# Patient Record
Sex: Female | Born: 1960 | Race: Black or African American | Hispanic: No | Marital: Single | Smoking: Current every day smoker
Health system: Southern US, Community
[De-identification: ages and names within clinical notes are randomized; demographics above are authoritative.]

## PROBLEM LIST (undated history)

## (undated) DIAGNOSIS — F1010 Alcohol abuse, uncomplicated: Secondary | ICD-10-CM

## (undated) HISTORY — PX: BREAST LUMPECTOMY: SHX2

---
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
## 2001-11-14 ENCOUNTER — Encounter: Payer: Self-pay | Admitting: Emergency Medicine

## 2001-11-14 ENCOUNTER — Emergency Department (HOSPITAL_COMMUNITY): Admission: EM | Admit: 2001-11-14 | Discharge: 2001-11-14 | Payer: Self-pay | Admitting: Emergency Medicine

## 2002-06-24 ENCOUNTER — Ambulatory Visit (HOSPITAL_COMMUNITY): Admission: RE | Admit: 2002-06-24 | Discharge: 2002-06-24 | Payer: Self-pay

## 2011-05-13 ENCOUNTER — Inpatient Hospital Stay (INDEPENDENT_AMBULATORY_CARE_PROVIDER_SITE_OTHER)
Admission: RE | Admit: 2011-05-13 | Discharge: 2011-05-13 | Disposition: A | Discharge status: Home / Self Care | Payer: Self-pay | Source: Ambulatory Visit | Attending: Family Medicine | Admitting: Family Medicine

## 2011-05-13 ENCOUNTER — Ambulatory Visit (INDEPENDENT_AMBULATORY_CARE_PROVIDER_SITE_OTHER): Payer: Self-pay

## 2011-05-13 DIAGNOSIS — S52609B Unspecified fracture of lower end of unspecified ulna, initial encounter for open fracture type I or II: Secondary | ICD-10-CM

## 2011-05-13 DIAGNOSIS — H6120 Impacted cerumen, unspecified ear: Secondary | ICD-10-CM

## 2011-07-19 ENCOUNTER — Emergency Department (HOSPITAL_COMMUNITY)
Admission: EM | Admit: 2011-07-19 | Discharge: 2011-07-19 | Disposition: A | Discharge status: Home / Self Care | Payer: Self-pay

## 2011-07-19 NOTE — ED Notes (Signed)
Patient stated that she was leaving because there were too many people waiting to be seen and walked out of the department with her family member.

## 2011-08-30 ENCOUNTER — Ambulatory Visit (INDEPENDENT_AMBULATORY_CARE_PROVIDER_SITE_OTHER): Payer: Self-pay | Admitting: *Deleted

## 2011-08-30 ENCOUNTER — Ambulatory Visit (HOSPITAL_COMMUNITY)
Admission: RE | Admit: 2011-08-30 | Discharge: 2011-08-30 | Disposition: A | Discharge status: Home / Self Care | Payer: No Typology Code available for payment source | Source: Ambulatory Visit | Attending: Obstetrics and Gynecology | Admitting: Obstetrics and Gynecology

## 2011-08-30 ENCOUNTER — Other Ambulatory Visit: Payer: Self-pay | Admitting: Obstetrics and Gynecology

## 2011-08-30 VITALS — BP 132/77 | HR 78 | Temp 99.0°F | Resp 16 | Ht 65.0 in | Wt 99.0 lb

## 2011-08-30 DIAGNOSIS — Z1231 Encounter for screening mammogram for malignant neoplasm of breast: Secondary | ICD-10-CM

## 2011-08-30 DIAGNOSIS — Z01419 Encounter for gynecological examination (general) (routine) without abnormal findings: Secondary | ICD-10-CM

## 2011-08-30 NOTE — Patient Instructions (Signed)
Taught patient how to perform BSE and gave educational materials to take home. Let patient know [facility] will cover Pap smears every 3 years unless has a history of abnormal Pap smears. Patient is escorted to mammography for a screening mammogram.  Let patient know will follow up with her within the next couple weeks with results by letter or phone. Patient verbalized understanding.

## 2011-08-30 NOTE — Progress Notes (Signed)
No complaints today.  Pap Smear:    Completed Pap smear today. Last Pap smear was at least 3 years ago per patient at one of the free cervical cancer screenings at the [facility]. Per patient no history of abnormal Pap smears. No Pap smear results in EPIC.  Physical exam: Breasts Breasts symmetrical. No skin abnormalities right breast. Scar under the left breast where patient states had a lumpectomy 20 years ago for benign reasons. No nipple retraction bilateral breasts. No nipple discharge bilateral breasts. No lymphadenopathy. No lumps palpated bilateral breasts.          Pelvic/Bimanual   Ext Genitalia No lesions, no swelling and no discharge observed on external genitalia.         Vagina Vagina pink and normal texture. No lesions or discharge observed in vagina.          Cervix Cervix is present. Cervix pink and of normal texture. No discharge observed at cervical os.    Uterus Uterus is present and palpable. Uterus in normal position and normal size.       Adnexae Bilateral ovaries present and palpable. No tenderness on palpation.        Rectovaginal No rectal exam completed today since patient had no rectal complaints. No skin abnormalities observed on rectal area.

## 2011-09-06 ENCOUNTER — Other Ambulatory Visit: Payer: Self-pay | Admitting: Obstetrics and Gynecology

## 2011-09-06 DIAGNOSIS — R928 Other abnormal and inconclusive findings on diagnostic imaging of breast: Secondary | ICD-10-CM

## 2011-09-12 ENCOUNTER — Telehealth: Payer: Self-pay | Admitting: *Deleted

## 2011-09-12 NOTE — Telephone Encounter (Signed)
Patient left me a voicemail to call her back in regards to her mammogram results. Attempted to call patient at [phone] and was given an alternative number to reach patient. The number given was [phone]. Attempted to call the alternative number. No one answered phone. Left message for patient to call me back.

## 2011-09-14 ENCOUNTER — Telehealth: Payer: Self-pay | Admitting: *Deleted

## 2011-09-14 NOTE — Telephone Encounter (Signed)
Patient had called and left me a voicemail in regards to needed follow up on mammogram. Called patient back and let her know she can call the [facility] to schedule her follow up appointment for her diagnostic mammogram of her left breast. Gave her phone number and informed patient that [facility] will cover follow up. Also, gave patient results to Pap smear letting her know it was normal. Patient verbalized understanding.

## 2011-09-21 ENCOUNTER — Other Ambulatory Visit: Payer: Self-pay

## 2011-09-28 ENCOUNTER — Other Ambulatory Visit: Payer: Self-pay

## 2011-10-05 ENCOUNTER — Ambulatory Visit
Admission: RE | Admit: 2011-10-05 | Discharge: 2011-10-05 | Disposition: A | Discharge status: Home / Self Care | Payer: No Typology Code available for payment source | Source: Ambulatory Visit | Attending: Obstetrics and Gynecology | Admitting: Obstetrics and Gynecology

## 2011-10-05 ENCOUNTER — Other Ambulatory Visit: Payer: Self-pay | Admitting: Obstetrics and Gynecology

## 2011-10-05 DIAGNOSIS — R928 Other abnormal and inconclusive findings on diagnostic imaging of breast: Secondary | ICD-10-CM

## 2011-10-11 ENCOUNTER — Other Ambulatory Visit: Payer: No Typology Code available for payment source

## 2011-10-12 ENCOUNTER — Ambulatory Visit
Admission: RE | Admit: 2011-10-12 | Discharge: 2011-10-12 | Disposition: A | Discharge status: Home / Self Care | Payer: No Typology Code available for payment source | Source: Ambulatory Visit | Attending: Obstetrics and Gynecology | Admitting: Obstetrics and Gynecology

## 2011-10-12 DIAGNOSIS — R928 Other abnormal and inconclusive findings on diagnostic imaging of breast: Secondary | ICD-10-CM

## 2011-11-25 ENCOUNTER — Inpatient Hospital Stay (HOSPITAL_COMMUNITY)
Admission: EM | Admit: 2011-11-25 | Discharge: 2011-12-07 | DRG: 327 | Disposition: A | Discharge status: Home / Self Care | Payer: Self-pay | Attending: Surgery | Admitting: Surgery

## 2011-11-25 ENCOUNTER — Encounter (HOSPITAL_COMMUNITY): Payer: Self-pay | Admitting: Emergency Medicine

## 2011-11-25 DIAGNOSIS — K567 Ileus, unspecified: Secondary | ICD-10-CM | POA: Diagnosis not present

## 2011-11-25 DIAGNOSIS — F1010 Alcohol abuse, uncomplicated: Secondary | ICD-10-CM | POA: Diagnosis present

## 2011-11-25 DIAGNOSIS — K275 Chronic or unspecified peptic ulcer, site unspecified, with perforation: Secondary | ICD-10-CM

## 2011-11-25 DIAGNOSIS — K560 Paralytic ileus: Secondary | ICD-10-CM | POA: Diagnosis not present

## 2011-11-25 DIAGNOSIS — F1990 Other psychoactive substance use, unspecified, uncomplicated: Secondary | ICD-10-CM | POA: Diagnosis present

## 2011-11-25 DIAGNOSIS — E876 Hypokalemia: Secondary | ICD-10-CM | POA: Diagnosis not present

## 2011-11-25 DIAGNOSIS — F17200 Nicotine dependence, unspecified, uncomplicated: Secondary | ICD-10-CM | POA: Diagnosis present

## 2011-11-25 DIAGNOSIS — R112 Nausea with vomiting, unspecified: Secondary | ICD-10-CM | POA: Diagnosis present

## 2011-11-25 DIAGNOSIS — K9189 Other postprocedural complications and disorders of digestive system: Secondary | ICD-10-CM | POA: Diagnosis not present

## 2011-11-25 DIAGNOSIS — Z681 Body mass index (BMI) 19 or less, adult: Secondary | ICD-10-CM

## 2011-11-25 DIAGNOSIS — K251 Acute gastric ulcer with perforation: Principal | ICD-10-CM | POA: Diagnosis present

## 2011-11-25 DIAGNOSIS — R197 Diarrhea, unspecified: Secondary | ICD-10-CM | POA: Diagnosis not present

## 2011-11-25 DIAGNOSIS — E46 Unspecified protein-calorie malnutrition: Secondary | ICD-10-CM | POA: Diagnosis present

## 2011-11-25 DIAGNOSIS — F1410 Cocaine abuse, uncomplicated: Secondary | ICD-10-CM | POA: Diagnosis present

## 2011-11-25 HISTORY — DX: Alcohol abuse, uncomplicated: F10.10

## 2011-11-25 NOTE — ED Notes (Signed)
Pt presented to the ER with c/o abd pain, epigastric, 8/10, intermittent, and right shoulder pain, pt further explains that sx are present for about week now and progressively worsen.

## 2011-11-25 NOTE — ED Notes (Signed)
Pt reports chronic ETOH heavy use, had pain in mid abdomen for 5 days. Pt appears intoxicated. Denies SI and HI.

## 2011-11-25 NOTE — ED Notes (Signed)
[id number]> Expected date:<BR> Expected time:<BR> Means of arrival:<BR> Comments:<BR> Hold

## 2011-11-26 ENCOUNTER — Emergency Department (HOSPITAL_COMMUNITY): Payer: Self-pay

## 2011-11-26 ENCOUNTER — Emergency Department (HOSPITAL_COMMUNITY): Payer: Self-pay | Admitting: Certified Registered Nurse Anesthetist

## 2011-11-26 ENCOUNTER — Encounter (HOSPITAL_COMMUNITY): Payer: Self-pay | Admitting: Certified Registered Nurse Anesthetist

## 2011-11-26 ENCOUNTER — Encounter (HOSPITAL_COMMUNITY)
Admission: EM | Disposition: A | Discharge status: Home / Self Care | Payer: Self-pay | Source: Home / Self Care | Attending: Surgery

## 2011-11-26 DIAGNOSIS — K255 Chronic or unspecified gastric ulcer with perforation: Secondary | ICD-10-CM

## 2011-11-26 HISTORY — PX: LAPAROTOMY: SHX154

## 2011-11-26 LAB — CBC
MCH: 33.9 pg (ref 26.0–34.0)
Platelets: 251 10*3/uL (ref 150–400)
RBC: 4.72 MIL/uL (ref 3.87–5.11)
WBC: 3.3 10*3/uL — ABNORMAL LOW (ref 4.0–10.5)

## 2011-11-26 LAB — RAPID URINE DRUG SCREEN, HOSP PERFORMED
Amphetamines: NOT DETECTED
Opiates: NOT DETECTED
Tetrahydrocannabinol: POSITIVE — AB

## 2011-11-26 LAB — COMPREHENSIVE METABOLIC PANEL
ALT: 22 U/L (ref 0–35)
AST: 44 U/L — ABNORMAL HIGH (ref 0–37)
CO2: 27 mEq/L (ref 19–32)
Calcium: 8.7 mg/dL (ref 8.4–10.5)
Chloride: 96 mEq/L (ref 96–112)
GFR calc non Af Amer: >90 mL/min (ref >90)
Sodium: 136 mEq/L (ref 135–145)

## 2011-11-26 SURGERY — LAPAROTOMY, EXPLORATORY
Anesthesia: General | Site: Abdomen | Wound class: Dirty or Infected

## 2011-11-26 MED ORDER — DIPHENHYDRAMINE HCL 50 MG/ML IJ SOLN
12.5000 mg | Freq: Four times a day (QID) | INTRAMUSCULAR | Status: DC | PRN
  Administered 2011-11-26 – 2011-11-27 (×2): 12.5 mg via INTRAVENOUS
  Filled 2011-11-26 (×2): qty 1

## 2011-11-26 MED ORDER — PHENYLEPHRINE HCL 10 MG/ML IJ SOLN
20.0000 mg | INTRAVENOUS | Status: DC | PRN
  Administered 2011-11-26: 50 ug/min via INTRAVENOUS

## 2011-11-26 MED ORDER — NEOSTIGMINE METHYLSULFATE 1 MG/ML IJ SOLN
INTRAMUSCULAR | Status: DC | PRN
  Administered 2011-11-26: 4 mg via INTRAVENOUS

## 2011-11-26 MED ORDER — ONDANSETRON HCL 4 MG PO TABS
4.0000 mg | ORAL_TABLET | Freq: Four times a day (QID) | ORAL | Status: DC | PRN
  Filled 2011-11-26: qty 1

## 2011-11-26 MED ORDER — MUPIROCIN 2 % EX OINT
1.0000 "application " | TOPICAL_OINTMENT | Freq: Two times a day (BID) | CUTANEOUS | Status: AC
  Administered 2011-11-26 – 2011-11-30 (×10): 1 via NASAL
  Filled 2011-11-26: qty 22

## 2011-11-26 MED ORDER — MORPHINE SULFATE (PF) 1 MG/ML IV SOLN
INTRAVENOUS | Status: DC
  Administered 2011-11-26: 07:00:00 via INTRAVENOUS
  Administered 2011-11-26 (×2): 7.5 mg via INTRAVENOUS
  Administered 2011-11-27: 6 mg via INTRAVENOUS
  Administered 2011-11-27: 1.5 mg via INTRAVENOUS
  Administered 2011-11-27: 14:00:00 via INTRAVENOUS
  Administered 2011-11-27: 4.5 mg via INTRAVENOUS
  Administered 2011-11-27: 3 mg via INTRAVENOUS
  Administered 2011-11-28: 4.5 mg via INTRAVENOUS
  Administered 2011-11-28: 3 mg via INTRAVENOUS
  Administered 2011-11-28: 9 mg via INTRAVENOUS
  Administered 2011-11-28: 5.51 mg via INTRAVENOUS
  Administered 2011-11-28: 4.5 mg via INTRAVENOUS
  Administered 2011-11-28: 7.5 mg via INTRAVENOUS
  Administered 2011-11-28: 1.5 mg via INTRAVENOUS
  Administered 2011-11-28: 09:00:00 via INTRAVENOUS
  Administered 2011-11-29: 2.43 mg via INTRAVENOUS
  Administered 2011-11-29: 07:00:00 via INTRAVENOUS
  Administered 2011-11-29: 3 mg via INTRAVENOUS
  Administered 2011-11-29: 4.5 mg via INTRAVENOUS
  Administered 2011-11-29: 1.5 mg via INTRAVENOUS
  Administered 2011-11-29: 4.5 mg via INTRAVENOUS
  Administered 2011-11-30: 6 mg via INTRAVENOUS
  Administered 2011-11-30: 3 mg via INTRAVENOUS
  Administered 2011-11-30: 12:00:00 via INTRAVENOUS
  Administered 2011-11-30 – 2011-12-01 (×3): 3 mg via INTRAVENOUS
  Administered 2011-12-01: 0.49 mg via INTRAVENOUS
  Administered 2011-12-01: 4.79 mg via INTRAVENOUS
  Administered 2011-12-01: 4.5 mg via INTRAVENOUS
  Administered 2011-12-01: 1.5 mg via INTRAVENOUS
  Administered 2011-12-01: 17:00:00 via INTRAVENOUS
  Administered 2011-12-02: 7.5 mg via INTRAVENOUS
  Administered 2011-12-02 (×2): 3 mg via INTRAVENOUS
  Administered 2011-12-02: 1.5 mg via INTRAVENOUS
  Filled 2011-11-26 (×7): qty 25

## 2011-11-26 MED ORDER — PANTOPRAZOLE SODIUM 40 MG IV SOLR
40.0000 mg | Freq: Two times a day (BID) | INTRAVENOUS | Status: DC
  Administered 2011-11-26 – 2011-12-03 (×16): 40 mg via INTRAVENOUS
  Filled 2011-11-26 (×20): qty 40

## 2011-11-26 MED ORDER — BIOTENE DRY MOUTH MT LIQD
15.0000 mL | Freq: Two times a day (BID) | OROMUCOSAL | Status: DC
  Administered 2011-11-27 – 2011-12-07 (×18): 15 mL via OROMUCOSAL

## 2011-11-26 MED ORDER — HETASTARCH-ELECTROLYTES 6 % IV SOLN
INTRAVENOUS | Status: DC | PRN
  Administered 2011-11-26: 04:00:00 via INTRAVENOUS

## 2011-11-26 MED ORDER — DEXAMETHASONE SODIUM PHOSPHATE 10 MG/ML IJ SOLN
INTRAMUSCULAR | Status: DC | PRN
  Administered 2011-11-26: 10 mg via INTRAVENOUS

## 2011-11-26 MED ORDER — SODIUM CHLORIDE 0.9 % IJ SOLN
9.0000 mL | INTRAMUSCULAR | Status: DC | PRN
  Administered 2011-11-29: 9 mL via INTRAVENOUS

## 2011-11-26 MED ORDER — DIPHENHYDRAMINE HCL 12.5 MG/5ML PO ELIX
12.5000 mg | ORAL_SOLUTION | Freq: Four times a day (QID) | ORAL | Status: DC | PRN
  Filled 2011-11-26: qty 5

## 2011-11-26 MED ORDER — FENTANYL CITRATE 0.05 MG/ML IJ SOLN
INTRAMUSCULAR | Status: DC | PRN
  Administered 2011-11-26: 25 ug via INTRAVENOUS
  Administered 2011-11-26: 50 ug via INTRAVENOUS
  Administered 2011-11-26 (×3): 25 ug via INTRAVENOUS

## 2011-11-26 MED ORDER — PIPERACILLIN-TAZOBACTAM 3.375 G IVPB
3.3750 g | Freq: Three times a day (TID) | INTRAVENOUS | Status: DC
  Administered 2011-11-26 – 2011-12-06 (×30): 3.375 g via INTRAVENOUS
  Filled 2011-11-26 (×35): qty 50

## 2011-11-26 MED ORDER — HYDROMORPHONE HCL PF 1 MG/ML IJ SOLN
1.0000 mg | Freq: Once | INTRAMUSCULAR | Status: AC
  Administered 2011-11-26: 1 mg via INTRAVENOUS
  Filled 2011-11-26: qty 1

## 2011-11-26 MED ORDER — PROMETHAZINE HCL 25 MG/ML IJ SOLN: 6.2500 mg | INTRAMUSCULAR | Status: DC | PRN

## 2011-11-26 MED ORDER — MIDAZOLAM HCL 5 MG/5ML IJ SOLN
INTRAMUSCULAR | Status: DC | PRN
  Administered 2011-11-26: 2 mg via INTRAVENOUS

## 2011-11-26 MED ORDER — ONDANSETRON HCL 4 MG/2ML IJ SOLN
INTRAMUSCULAR | Status: DC | PRN
  Administered 2011-11-26: 4 mg via INTRAVENOUS

## 2011-11-26 MED ORDER — LORAZEPAM 1 MG PO TABS: 1.0000 mg | ORAL_TABLET | Freq: Four times a day (QID) | ORAL | Status: AC | PRN

## 2011-11-26 MED ORDER — LORAZEPAM 2 MG/ML IJ SOLN: 1.0000 mg | Freq: Four times a day (QID) | INTRAMUSCULAR | Status: AC | PRN

## 2011-11-26 MED ORDER — SODIUM CHLORIDE 0.9 % IV BOLUS (SEPSIS)
1000.0000 mL | Freq: Once | INTRAVENOUS | Status: AC
  Administered 2011-11-26: 1000 mL via INTRAVENOUS

## 2011-11-26 MED ORDER — CHLORHEXIDINE GLUCONATE 0.12 % MT SOLN
15.0000 mL | Freq: Two times a day (BID) | OROMUCOSAL | Status: DC
  Administered 2011-11-26 – 2011-12-07 (×21): 15 mL via OROMUCOSAL
  Filled 2011-11-26 (×25): qty 15

## 2011-11-26 MED ORDER — LIDOCAINE HCL (CARDIAC) 20 MG/ML IV SOLN
INTRAVENOUS | Status: DC | PRN
  Administered 2011-11-26: 60 mg via INTRAVENOUS

## 2011-11-26 MED ORDER — HEPARIN SODIUM (PORCINE) 5000 UNIT/ML IJ SOLN
5000.0000 [IU] | Freq: Three times a day (TID) | INTRAMUSCULAR | Status: DC
  Administered 2011-11-27 – 2011-12-07 (×32): 5000 [IU] via SUBCUTANEOUS
  Filled 2011-11-26 (×34): qty 1

## 2011-11-26 MED ORDER — 0.9 % SODIUM CHLORIDE (POUR BTL) OPTIME
TOPICAL | Status: DC | PRN
  Administered 2011-11-26: 4000 mL

## 2011-11-26 MED ORDER — ONDANSETRON HCL 4 MG/2ML IJ SOLN: 4.0000 mg | Freq: Four times a day (QID) | INTRAMUSCULAR | Status: DC | PRN

## 2011-11-26 MED ORDER — LACTATED RINGERS IV SOLN
INTRAVENOUS | Status: DC | PRN
  Administered 2011-11-26: 04:00:00 via INTRAVENOUS

## 2011-11-26 MED ORDER — CHLORHEXIDINE GLUCONATE CLOTH 2 % EX PADS
6.0000 | MEDICATED_PAD | Freq: Every day | CUTANEOUS | Status: AC
  Administered 2011-11-27 – 2011-12-01 (×5): 6 via TOPICAL

## 2011-11-26 MED ORDER — NALOXONE HCL 0.4 MG/ML IJ SOLN: 0.4000 mg | INTRAMUSCULAR | Status: DC | PRN

## 2011-11-26 MED ORDER — VITAMIN B-1 100 MG PO TABS
100.0000 mg | ORAL_TABLET | Freq: Every day | ORAL | Status: DC
  Administered 2011-11-29: 100 mg via ORAL
  Filled 2011-11-26 (×4): qty 1

## 2011-11-26 MED ORDER — SUCCINYLCHOLINE CHLORIDE 20 MG/ML IJ SOLN
INTRAMUSCULAR | Status: DC | PRN
  Administered 2011-11-26: 100 mg via INTRAVENOUS

## 2011-11-26 MED ORDER — PIPERACILLIN-TAZOBACTAM 3.375 G IVPB
3.3750 g | Freq: Once | INTRAVENOUS | Status: AC
  Administered 2011-11-26: 3.375 g via INTRAVENOUS
  Filled 2011-11-26: qty 50

## 2011-11-26 MED ORDER — HYDROMORPHONE HCL PF 1 MG/ML IJ SOLN
INTRAMUSCULAR | Status: AC
  Filled 2011-11-26: qty 1

## 2011-11-26 MED ORDER — HYDROMORPHONE HCL PF 1 MG/ML IJ SOLN
0.2500 mg | INTRAMUSCULAR | Status: DC | PRN
  Administered 2011-11-26: 0.5 mg via INTRAVENOUS

## 2011-11-26 MED ORDER — PROPOFOL 10 MG/ML IV EMUL
INTRAVENOUS | Status: DC | PRN
  Administered 2011-11-26: 100 mg via INTRAVENOUS

## 2011-11-26 MED ORDER — GLYCOPYRROLATE 0.2 MG/ML IJ SOLN
INTRAMUSCULAR | Status: DC | PRN
  Administered 2011-11-26: .6 mg via INTRAVENOUS

## 2011-11-26 MED ORDER — THIAMINE HCL 100 MG/ML IJ SOLN
100.0000 mg | Freq: Every day | INTRAMUSCULAR | Status: DC
  Administered 2011-11-26 – 2011-12-03 (×7): 100 mg via INTRAVENOUS
  Filled 2011-11-26 (×9): qty 1

## 2011-11-26 MED ORDER — KCL IN DEXTROSE-NACL 20-5-0.9 MEQ/L-%-% IV SOLN
INTRAVENOUS | Status: DC
  Administered 2011-11-26 – 2011-12-02 (×12): via INTRAVENOUS
  Administered 2011-12-02: 1000 mL via INTRAVENOUS
  Administered 2011-12-03: 100 mL/h via INTRAVENOUS
  Administered 2011-12-03 (×2): via INTRAVENOUS
  Filled 2011-11-26 (×28): qty 1000

## 2011-11-26 MED ORDER — ROCURONIUM BROMIDE 100 MG/10ML IV SOLN
INTRAVENOUS | Status: DC | PRN
  Administered 2011-11-26: 25 mg via INTRAVENOUS
  Administered 2011-11-26: 5 mg via INTRAVENOUS

## 2011-11-26 SURGICAL SUPPLY — 40 items
APPLICATOR COTTON TIP 6IN STRL (MISCELLANEOUS) IMPLANT
BLADE EXTENDED COATED 6.5IN (ELECTRODE) IMPLANT
BLADE HEX COATED 2.75 (ELECTRODE) ×2 IMPLANT
CANISTER SUCTION 2500CC (MISCELLANEOUS) IMPLANT
CLOTH BEACON ORANGE TIMEOUT ST (SAFETY) ×2 IMPLANT
COVER MAYO STAND STRL (DRAPES) IMPLANT
DRAPE LAPAROSCOPIC ABDOMINAL (DRAPES) ×2 IMPLANT
DRAPE UTILITY XL STRL (DRAPES) ×2 IMPLANT
DRAPE WARM FLUID 44X44 (DRAPE) ×2 IMPLANT
DRESSING TELFA 8X3 (GAUZE/BANDAGES/DRESSINGS) ×2 IMPLANT
DRSG PAD ABDOMINAL 8X10 ST (GAUZE/BANDAGES/DRESSINGS) ×2 IMPLANT
ELECT REM PT RETURN 9FT ADLT (ELECTROSURGICAL) ×2
ELECTRODE REM PT RTRN 9FT ADLT (ELECTROSURGICAL) ×1 IMPLANT
GLOVE BIOGEL PI IND STRL 7.0 (GLOVE) IMPLANT
GLOVE BIOGEL PI INDICATOR 7.0 (GLOVE)
GLOVE ECLIPSE 8.0 STRL XLNG CF (GLOVE) ×8 IMPLANT
GLOVE INDICATOR 8.0 STRL GRN (GLOVE) ×4 IMPLANT
GOWN STRL NON-REIN LRG LVL3 (GOWN DISPOSABLE) ×2 IMPLANT
GOWN STRL REIN XL XLG (GOWN DISPOSABLE) ×4 IMPLANT
KIT BASIN OR (CUSTOM PROCEDURE TRAY) ×2 IMPLANT
MANIFOLD NEPTUNE II (INSTRUMENTS) ×2 IMPLANT
NS IRRIG 1000ML POUR BTL (IV SOLUTION) ×10 IMPLANT
PACK GENERAL/GYN (CUSTOM PROCEDURE TRAY) ×2 IMPLANT
SPONGE GAUZE 4X4 12PLY (GAUZE/BANDAGES/DRESSINGS) ×2 IMPLANT
SPONGE LAP 18X18 X RAY DECT (DISPOSABLE) ×2 IMPLANT
STAPLER VISISTAT 35W (STAPLE) ×2 IMPLANT
SUCTION POOLE TIP (SUCTIONS) ×2 IMPLANT
SUT PDS AB 1 CTX 36 (SUTURE) ×4 IMPLANT
SUT SILK 2 0 (SUTURE) ×1
SUT SILK 2 0 SH CR/8 (SUTURE) ×2 IMPLANT
SUT SILK 2-0 18XBRD TIE 12 (SUTURE) ×1 IMPLANT
SUT SILK 3 0 (SUTURE) ×1
SUT SILK 3 0 SH CR/8 (SUTURE) IMPLANT
SUT SILK 3-0 18XBRD TIE 12 (SUTURE) ×1 IMPLANT
SUT VICRYL 2 0 18  UND BR (SUTURE)
SUT VICRYL 2 0 18 UND BR (SUTURE) IMPLANT
TAPE CLOTH SURG 6X10 WHT LF (GAUZE/BANDAGES/DRESSINGS) ×2 IMPLANT
TOWEL OR 17X26 10 PK STRL BLUE (TOWEL DISPOSABLE) ×2 IMPLANT
TRAY FOLEY CATH 14FRSI W/METER (CATHETERS) ×2 IMPLANT
YANKAUER SUCT BULB TIP NO VENT (SUCTIONS) IMPLANT

## 2011-11-26 NOTE — Transfer of Care (Signed)
Immediate Anesthesia Transfer of Care Note  Patient: Gabrielle Davis  Procedure(s) Performed: Procedure(s) (LRB): EXPLORATORY LAPAROTOMY (N/A) REPAIR OF PERFORATED ULCER (N/A)  Patient Location: PACU  Anesthesia Type: General  Level of Consciousness: sedated, patient cooperative and responds to stimulaton  Airway & Oxygen Therapy: Patient Spontanous Breathing and Patient connected to face mask oxgen  Post-op Assessment: Report given to PACU RN and Post -op Vital signs reviewed and stable  Post vital signs: Reviewed and stable  Complications: No apparent anesthesia complications

## 2011-11-26 NOTE — ED Notes (Signed)
51 year old female with a history of heavy alcohol use and cocaine use who presents with diffuse severe abdominal pain that started approximately one week ago when it was gradual onset, gradually worsening and became acutely worse approximately 7 hours ago. She has pain with moving coughing breathing.   physical exam:  She has severe diffuse peritoneal signs, no tachycardia, no swelling of the lower extremities, clear lungs.  Review of the medical records shows that the patient has no leukocytosis, CT scan shows that she has a perforated gastric ulcer, I discussed the signs with the radiologist, patient is n.p.o., antibiotics ordered, general surgeon paged  Medical screening examination/treatment/procedure(s) were conducted as a shared visit with non-physician practitioner(s) and myself.  I personally evaluated the patient during the encounter   Vida Roller, MD 11/26/11 [phone]

## 2011-11-26 NOTE — ED Notes (Signed)
Surgeon at the bedside assessing patient 

## 2011-11-26 NOTE — Anesthesia Preprocedure Evaluation (Addendum)
Anesthesia Evaluation  Patient identified by MRN, date of birth, ID band Patient awake    Reviewed: Allergy & Precautions, H&P , NPO status , Patient's Chart, lab work & pertinent test results  Airway Mallampati: II TM Distance: <3 FB Neck ROM: Full    Dental No notable dental hx.    Pulmonary Current Smoker,  breath sounds clear to auscultation  Pulmonary exam normal       Cardiovascular negative cardio ROS  Rhythm:Regular Rate:Normal     Neuro/Psych negative neurological ROS  negative psych ROS   GI/Hepatic negative GI ROS, (+)     substance abuse  alcohol use and cocaine use,   Endo/Other  negative endocrine ROS  Renal/GU negative Renal ROS  negative genitourinary   Musculoskeletal negative musculoskeletal ROS (+)   Abdominal   Peds negative pediatric ROS (+)  Hematology negative hematology ROS (+)   Anesthesia Other Findings   Reproductive/Obstetrics negative OB ROS                          Anesthesia Physical Anesthesia Plan  ASA: III and Emergent  Anesthesia Plan: General   Post-op Pain Management:    Induction: Intravenous, Rapid sequence and Cricoid pressure planned  Airway Management Planned: Oral ETT  Additional Equipment:   Intra-op Plan:   Post-operative Plan: Extubation in OR  Informed Consent: I have reviewed the patients History and Physical, chart, labs and discussed the procedure including the risks, benefits and alternatives for the proposed anesthesia with the patient or authorized representative who has indicated his/her understanding and acceptance.   Dental advisory given  Plan Discussed with: CRNA  Anesthesia Plan Comments: (Increased for periop CVA and or MI secondary to cocaine abuse.)       Anesthesia Quick Evaluation

## 2011-11-26 NOTE — ED Notes (Signed)
Patient transported to X-ray 

## 2011-11-26 NOTE — Anesthesia Postprocedure Evaluation (Signed)
  Anesthesia Post-op Note  Patient: Gabrielle Davis  Procedure(s) Performed: Procedure(s) (LRB): EXPLORATORY LAPAROTOMY (N/A) REPAIR OF PERFORATED ULCER (N/A)  Patient Location: PACU  Anesthesia Type: General  Level of Consciousness: awake and alert   Airway and Oxygen Therapy: Patient Spontanous Breathing  Post-op Pain: mild  Post-op Assessment: Post-op Vital signs reviewed, Patient's Cardiovascular Status Stable, Respiratory Function Stable, Patent Airway and No signs of Nausea or vomiting  Post-op Vital Signs: stable  Complications: No apparent anesthesia complications

## 2011-11-26 NOTE — ED Provider Notes (Signed)
History     CSN: [id number]  Arrival date & time 11/25/11  2304   First MD Initiated Contact with Patient 11/26/11 0036      Chief Complaint  Patient presents with  . Abdominal Pain  . Medical Clearance  . Alcohol Problem     HPI  History provided by the patient. Patient is a 51 year old female with history of alcohol abuse who presents with complaints of worsening abdominal pain for the past several days. She admits to daily heavy alcohol use. Patient also admits to some marijuana and cocaine use recently. She reports having diffuse abdominal pains they're sharp and severe. Patient denies any aggravating or alleviating factors. Symptoms are associated with nausea and vomiting episodes decreased appetite. Patient denies having similar symptoms previously. Patient has had cesarean sections in the past. No other abdominal surgery. Patient denies any fever, chills, sweats. She denies any diarrhea or constipation.    Past Medical History  Diagnosis Date  . ETOH abuse     Past Surgical History  Procedure Date  . Breast lumpectomy     left 20 years ago    History reviewed. No pertinent family history.  History  Substance Use Topics  . Smoking status: Current Everyday Smoker -- 0.5 packs/day for 30 years    Types: Cigarettes  . Smokeless tobacco: Not on file  . Alcohol Use: Yes    OB History    Grav Para Term Preterm Abortions TAB SAB Ect Mult Living                  Review of Systems  Constitutional: Positive for appetite change. Negative for fever and chills.  Respiratory: Negative for cough and shortness of breath.   Cardiovascular: Negative for chest pain.  Gastrointestinal: Positive for nausea, vomiting and abdominal pain. Negative for diarrhea and constipation.  Genitourinary: Negative for dysuria, frequency, hematuria, flank pain, vaginal bleeding and vaginal discharge.    Allergies  Codeine  Home Medications  No current outpatient prescriptions on  file.  BP 99/64  Pulse 75  Temp(Src) 97.7 F (36.5 C) (Oral)  Resp 18  Ht 5\' 7"  (1.702 m)  Wt 100 lb (45.36 kg)  BMI 15.66 kg/m2  SpO2 97%  Physical Exam  Nursing note and vitals reviewed. Constitutional: She is oriented to person, place, and time. She appears well-developed and well-nourished. No distress.  HENT:  Head: Normocephalic and atraumatic.  Neck: Normal range of motion. Neck supple.       No cervical midline tenderness. No meningeal signs.  Cardiovascular: Normal rate and regular rhythm.   Pulmonary/Chest: Effort normal and breath sounds normal. No respiratory distress. She has no wheezes. She has no rales.  Abdominal: Soft. She exhibits no distension. There is tenderness. There is rigidity and guarding. There is no CVA tenderness, no tenderness at McBurney's point and negative Murphy's sign.       Diffuse abdominal tenderness.  Neurological: She is alert and oriented to person, place, and time.  Skin: Skin is warm and dry. No rash noted.  Psychiatric: She has a normal mood and affect. Her behavior is normal.    ED Course  Procedures    Results for orders placed during the hospital encounter of 11/25/11  CBC      Component Value Range   WBC 3.3 (*) 4.0 - 10.5 (K/uL)   RBC 4.72  3.87 - 5.11 (MIL/uL)   Hemoglobin 16.0 (*) 12.0 - 15.0 (g/dL)   HCT 62.1 (*) 30.8 - 46.0 (%)  MCV 98.1  78.0 - 100.0 (fL)   MCH 33.9  26.0 - 34.0 (pg)   MCHC 34.6  30.0 - 36.0 (g/dL)   RDW 16.1  09.6 - 04.5 (%)   Platelets 251  150 - 400 (K/uL)  URINE RAPID DRUG SCREEN (HOSP PERFORMED)      Component Value Range   Opiates NONE DETECTED  NONE DETECTED    Cocaine POSITIVE (*) NONE DETECTED    Benzodiazepines NONE DETECTED  NONE DETECTED    Amphetamines NONE DETECTED  NONE DETECTED    Tetrahydrocannabinol POSITIVE (*) NONE DETECTED    Barbiturates NONE DETECTED  NONE DETECTED   LIPASE, BLOOD      Component Value Range   Lipase 34  11 - 59 (U/L)      Ct Abdomen Pelvis Wo  Contrast  11/26/2011  *RADIOLOGY REPORT*  Clinical Data: Diffuse abdominal pain.  Ethanol abuse.  CT ABDOMEN AND PELVIS WITHOUT CONTRAST  Technique:  Multidetector CT imaging of the abdomen and pelvis was performed following the standard protocol without intravenous contrast.  Comparison: None.  Findings: A small amount of free intraperitoneal air is seen anterior to the liver, consistent with perforated viscus.  Wall thickening is seen involving the gastric body and antrum, suggesting perforated gastric ulcer as the etiology of the free air.  There is generalized mesenteric edema and mild to moderate ascites in the pelvis.  Gallbladder sludge is noted without evidence of cholecystitis.  The abdominal parenchymal organs are unremarkable appearance on this noncontrast study.  Uterus and adnexa are unremarkable appearance.  IMPRESSION:  1. Mild pneumoperitoneum, with wall thickening of the gastric body and antrum.  This suggests perforated gastric ulcer as the etiology of the pneumoperitoneum. 2.  Generalized mesenteric edema and mild to moderate pelvic ascites.  Critical Value/emergent results were called by telephone at the time of interpretation on 11/26/2011  at 0210 hours  to  Dr. Hyacinth Meeker in the emergency department, who verbally acknowledged these results.  Original Report Authenticated By: Danae Orleans, M.D.     1. Perforated ulcer       MDM  Patient seen and evaluated. Patient in no acute distress. Patient appears uncomfortable.  Patient seen and discussed with attending physician. Patient with CT findings of perforated gastric ulcer. Surgery contacted and will admit.      Angus Seller, [place] 11/26/11 [phone]

## 2011-11-26 NOTE — ED Notes (Signed)
Pt changed into blue scrubs, pt and personal belongings wanded by security

## 2011-11-26 NOTE — Op Note (Signed)
Operative Note  Gabrielle Davis female 51 y.o. 11/26/2011  PREOPERATIVE DX:  Perforated viscus  POSTOPERATIVE DX:  Anterior gastric perforation-prepyloric  PROCEDURE:  Emergency exploratory laparotomy, biopsy of anterior gastric perforation followed by primary closure and omental patching         Surgeon: Adolph Pollack   Assistants: None  Anesthesia: General endotracheal anesthesia  Indications: This is a 51 year old female with a weeklong history of epigastric pain that became acutely worse yesterday. She presented to the emergency department and had clinical findings of peritonitis and a CT scan consistent with a perforation. CT scan suggested it was in the anterior stomach distally. She brought to the operating room for emergency exploratory laparotomy.   Procedure Detail:  She is brought to the operating room placed supine on operating table and a general anesthetic was induced. A Foley catheter was inserted. A nasogastric tube was inserted. The abdominal wall was fairly prepped and draped. An upper midline incision was made and carried just inferior to the umbilicus dividing the skin subcutaneous tissue fascia and peritoneum. Upon entering the peritoneal cavity a moderate to large amount of cloudy fluid was encountered and evacuated.  The anterior stomach was inspected and a small perforation was noted in the prepyloric region. This was oversewed with interrupted 2-0 silk sutures.   I examined the small intestine and large intestine and no other obvious abnormalities were noted. 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  No liver lesions were noted.  The abdominal cavity was copiously irrigated with warm saline solution which was then evacuated. There was an omental adhesion to the uterus which was freed up. An omental patch was then placed over the primary closure and anchored to the stomach with interrupted 2-0 silk sutures. There was no evidence of leak  from the primary closure.  The NG tube was placed proximal to the perforation repair.  There was no evidence of bleeding or organ injury at this time.  The midline fascia was then closed with running #1 PDS suture. The skin was closed with staples leaving gaps between the staples. Telfa wicks were then placed in the gaps. A bulky dressing was applied.   She tolerated the procedure well without any apparent complications and was taken to the recovery room in satisfactory condition. She will be taken to the intensive care unit for postoperative care.    Findings: Prepyloric anterior gastric perforation  Estimated Blood Loss:  200 mL         Drains: none          Blood Given: none          Specimens: Biopsy of anterior stomach        Complications:  * No complications entered in OR log *         Disposition: PACU - hemodynamically stable.         Condition: stable

## 2011-11-26 NOTE — H&P (Signed)
Gabrielle Davis is an 51 y.o. female.   Chief Complaint: Severe upper abdominal pain HPI: This is a 51 year old female who has been having upper abdominal pain for about a week. She took antacids and it helped some. Yesterday afternoon the pain got acutely worse and radiated down to her lower abdomen. She had some nausea and vomiting. She's had some chills and sweats. No diarrhea. She denies peptic ulcer disease. She presented to the emergency department for evaluation and had findings consistent with a gastrointestinal perforation most likely gastric ulcer. I was called to see her at that time.  Past Medical History  Diagnosis Date  . ETOH abuse     Past Surgical History  Procedure Date  . Breast lumpectomy     left 20 years ago    History reviewed. No pertinent family history. Social History:  reports that she has been smoking Cigarettes.  She has a 15 pack-year smoking history. She does not have any smokeless tobacco history on file. She reports that she drinks alcohol daily-about 40 ounces. She reports that she used marijuana and cocaine recently.  Allergies:  Allergies  Allergen Reactions  . Codeine Itching    Medications Prior to Admission  Medication Dose Route Frequency Provider Last Rate Last Dose  . HYDROmorphone (DILAUDID) injection 1 mg  1 mg Intravenous Once Angus Seller, PA   1 mg at 11/26/11 0239  . piperacillin-tazobactam (ZOSYN) IVPB 3.375 g  3.375 g Intravenous Once Vida Roller, MD   3.375 g at 11/26/11 0304  . sodium chloride 0.9 % bolus 1,000 mL  1,000 mL Intravenous Once Angus Seller, PA   1,000 mL at 11/26/11 0148  . sodium chloride 0.9 % bolus 1,000 mL  1,000 mL Intravenous Once Angus Seller, PA   1,000 mL at 11/26/11 0239   No current outpatient prescriptions on file as of 11/25/2011.   Prior to Admission medications   Not on File  antacid  Results for orders placed during the hospital encounter of 11/25/11 (from the past 48 hour(s))  URINE RAPID  DRUG SCREEN (HOSP PERFORMED)     Status: Abnormal   Collection Time   11/26/11 12:58 AM      Component Value Range Comment   Opiates NONE DETECTED  NONE DETECTED     Cocaine POSITIVE (*) NONE DETECTED     Benzodiazepines NONE DETECTED  NONE DETECTED     Amphetamines NONE DETECTED  NONE DETECTED     Tetrahydrocannabinol POSITIVE (*) NONE DETECTED     Barbiturates NONE DETECTED  NONE DETECTED    CBC     Status: Abnormal   Collection Time   11/26/11  1:50 AM      Component Value Range Comment   WBC 3.3 (*) 4.0 - 10.5 (K/uL)    RBC 4.72  3.87 - 5.11 (MIL/uL)    Hemoglobin 16.0 (*) 12.0 - 15.0 (g/dL)    HCT 16.1 (*) 09.6 - 46.0 (%)    MCV 98.1  78.0 - 100.0 (fL)    MCH 33.9  26.0 - 34.0 (pg)    MCHC 34.6  30.0 - 36.0 (g/dL)    RDW 04.5  40.9 - 81.1 (%)    Platelets 251  150 - 400 (K/uL)   COMPREHENSIVE METABOLIC PANEL     Status: Abnormal   Collection Time   11/26/11  1:50 AM      Component Value Range Comment   Sodium 136  135 - 145 (mEq/L)  Potassium 4.8  3.5 - 5.1 (mEq/L) SLIGHT HEMOLYSIS   Chloride 96  96 - 112 (mEq/L)    CO2 27  19 - 32 (mEq/L)    Glucose, Bld 97  70 - 99 (mg/dL)    BUN 7  6 - 23 (mg/dL)    Creatinine, Ser 9.56  0.50 - 1.10 (mg/dL)    Calcium 8.7  8.4 - 10.5 (mg/dL)    Total Protein 6.5  6.0 - 8.3 (g/dL)    Albumin 3.3 (*) 3.5 - 5.2 (g/dL)    AST 44 (*) 0 - 37 (U/L) SLIGHT HEMOLYSIS   ALT 22  0 - 35 (U/L)    Alkaline Phosphatase 63  39 - 117 (U/L)    Total Bilirubin 0.7  0.3 - 1.2 (mg/dL)    GFR calc non Af Amer >90  >90 (mL/min)    GFR calc Af Amer >90  >90 (mL/min)   ETHANOL     Status: Normal   Collection Time   11/26/11  1:50 AM      Component Value Range Comment   Alcohol, Ethyl (B) <11  0 - 11 (mg/dL)   ACETAMINOPHEN LEVEL     Status: Normal   Collection Time   11/26/11  1:50 AM      Component Value Range Comment   Acetaminophen (Tylenol), Serum <15.0  10 - 30 (ug/mL)   LIPASE, BLOOD     Status: Normal   Collection Time   11/26/11  1:50 AM       Component Value Range Comment   Lipase 34  11 - 59 (U/L)    Ct Abdomen Pelvis Wo Contrast  11/26/2011  *RADIOLOGY REPORT*  Clinical Data: Diffuse abdominal pain.  Ethanol abuse.  CT ABDOMEN AND PELVIS WITHOUT CONTRAST  Technique:  Multidetector CT imaging of the abdomen and pelvis was performed following the standard protocol without intravenous contrast.  Comparison: None.  Findings: A small amount of free intraperitoneal air is seen anterior to the liver, consistent with perforated viscus.  Wall thickening is seen involving the gastric body and antrum, suggesting perforated gastric ulcer as the etiology of the free air.  There is generalized mesenteric edema and mild to moderate ascites in the pelvis.  Gallbladder sludge is noted without evidence of cholecystitis.  The abdominal parenchymal organs are unremarkable appearance on this noncontrast study.  Uterus and adnexa are unremarkable appearance.  IMPRESSION:  1. Mild pneumoperitoneum, with wall thickening of the gastric body and antrum.  This suggests perforated gastric ulcer as the etiology of the pneumoperitoneum. 2.  Generalized mesenteric edema and mild to moderate pelvic ascites.  Critical Value/emergent results were called by telephone at the time of interpretation on 11/26/2011  at 0210 hours  to  Dr. Hyacinth Meeker in the emergency department, who verbally acknowledged these results.  Original Report Authenticated By: Danae Orleans, M.D.    Review of Systems  Constitutional: Positive for fever and chills.  HENT: Negative for congestion and sore throat.   Respiratory: Positive for shortness of breath (Secondary to the pain). Negative for cough.   Cardiovascular: Negative for chest pain.  Gastrointestinal: Positive for heartburn, nausea, vomiting and abdominal pain. Negative for diarrhea.       No liver disease  Genitourinary: Negative for dysuria and hematuria.  Neurological: Negative for seizures and headaches.  Endo/Heme/Allergies: Does not  bruise/bleed easily.    Blood pressure 97/71, pulse 77, temperature 97.7 F (36.5 C), temperature source Oral, resp. rate 18, height 5\' 7"  (1.702 m),  weight 100 lb (45.36 kg), SpO2 97.00%. Physical Exam  Constitutional: She appears distressed.       Thin, ill-appearing female  HENT:  Head: Normocephalic and atraumatic.  Eyes: EOM are normal. No scleral icterus.  Neck: Normal range of motion.  Cardiovascular: Normal rate and regular rhythm.   Respiratory:       Shallow respirations with breath sounds that are clear  GI: She exhibits distension. There is tenderness. There is rebound and guarding.  Musculoskeletal: She exhibits no edema.       Left leg scar  Lymphadenopathy:    She has no cervical adenopathy.  Neurological: She is alert.  Skin: Skin is warm and dry.  Psychiatric: She has a normal mood and affect. Her behavior is normal.     Assessment/Plan Perforated viscus most likely gastric or duodenal ulcer. She has been started on IV Zosyn. She also has a history of heavy alcohol use and recent cocaine and marijuana use.  Plan: To the operating room for emergency exploratory laparotomy and repair of gastrointestinal perforation, possible bowel resection, possible colostomy.  I have discussed the procedure and risks with her. The risks include but not limited to bleeding, infection, wound healing problems, anesthesia, axial injury to intra-abdominal organs, and need for repeat operation. She seems to understand all this and agrees with the plan.  Dyane Broberg J 11/26/2011, 3:06 AM

## 2011-11-27 LAB — BASIC METABOLIC PANEL
BUN: 9 mg/dL (ref 6–23)
Chloride: 108 mEq/L (ref 96–112)
Creatinine, Ser: 0.7 mg/dL (ref 0.50–1.10)
GFR calc Af Amer: >90 mL/min (ref >90)
GFR calc non Af Amer: >90 mL/min (ref >90)
Glucose, Bld: 128 mg/dL — ABNORMAL HIGH (ref 70–99)
Potassium: 4.3 mEq/L (ref 3.5–5.1)

## 2011-11-27 LAB — CBC
HCT: 35.1 % — ABNORMAL LOW (ref 36.0–46.0)
Hemoglobin: 11.7 g/dL — ABNORMAL LOW (ref 12.0–15.0)
MCH: 33.6 pg (ref 26.0–34.0)
MCHC: 33.3 g/dL (ref 30.0–36.0)
RDW: 15.3 % (ref 11.5–15.5)

## 2011-11-27 NOTE — Progress Notes (Signed)
1 Day Post-Op  Subjective: Sore, but tolerable and controlled with meds. Was up in chair yesterday. Wants some po fluid.  Objective: Vital signs in last 24 hours: Temp:  [97.8 F (36.6 C)-98.4 F (36.9 C)] 98.3 F (36.8 C) (04/14 0400) Pulse Rate:  [70-83] 71  (04/14 0400) Resp:  [11-23] 16  (04/14 0400) BP: (99-129)/(69-90) 100/69 mmHg (04/14 0400) SpO2:  [96 %-100 %] 98 % (04/14 0400) Weight:  [100 lb 5 oz (45.5 kg)] 100 lb 5 oz (45.5 kg) (04/14 0000)   Intake/Output from previous day: 04/13 0701 - 04/14 0700 In: 2550 [I.V.:2500; IV Piggyback:50] Out: 610 [Urine:610] Intake/Output this shift:     General appearance: alert, cooperative and mild distress Resp: clear to auscultation bilaterally GI: Soft, but distended some and mildly tender throughout. No BS yet  Incision: Clean with wicks in place  Lab Results:   Basename 11/27/11 0348 11/26/11 0150  WBC 10.9* 3.3*  HGB 11.7* 16.0*  HCT 35.1* 46.3*  PLT 183 251   BMET  Basename 11/27/11 0348 11/26/11 0150  NA 134* 136  K 4.3 4.8  CL 108 96  CO2 27 27  GLUCOSE 128* 97  BUN 9 7  CREATININE 0.70 0.70  CALCIUM 7.8* 8.7   PT/INR No results found for this basename: LABPROT:2,INR:2 in the last 72 hours ABG No results found for this basename: PHART:2,PCO2:2,PO2:2,HCO3:2 in the last 72 hours  MEDS, Scheduled    . antiseptic oral rinse  15 mL Mouth Rinse q12n4p  . chlorhexidine  15 mL Mouth Rinse BID  . Chlorhexidine Gluconate Cloth  6 each Topical Q0600  . heparin  5,000 Units Subcutaneous Q8H  . HYDROmorphone      . morphine   Intravenous Q4H  . mupirocin ointment  1 application Nasal BID  . pantoprazole (PROTONIX) IV  40 mg Intravenous Q12H  . piperacillin-tazobactam (ZOSYN)  IV  3.375 g Intravenous Q8H  . thiamine  100 mg Oral Daily   Or  . thiamine  100 mg Intravenous Daily    Studies/Results: Ct Abdomen Pelvis Wo Contrast  11/26/2011  *RADIOLOGY REPORT*  Clinical Data: Diffuse abdominal pain.   Ethanol abuse.  CT ABDOMEN AND PELVIS WITHOUT CONTRAST  Technique:  Multidetector CT imaging of the abdomen and pelvis was performed following the standard protocol without intravenous contrast.  Comparison: None.  Findings: A small amount of free intraperitoneal air is seen anterior to the liver, consistent with perforated viscus.  Wall thickening is seen involving the gastric body and antrum, suggesting perforated gastric ulcer as the etiology of the free air.  There is generalized mesenteric edema and mild to moderate ascites in the pelvis.  Gallbladder sludge is noted without evidence of cholecystitis.  The abdominal parenchymal organs are unremarkable appearance on this noncontrast study.  Uterus and adnexa are unremarkable appearance.  IMPRESSION:  1. Mild pneumoperitoneum, with wall thickening of the gastric body and antrum.  This suggests perforated gastric ulcer as the etiology of the pneumoperitoneum. 2.  Generalized mesenteric edema and mild to moderate pelvic ascites.  Critical Value/emergent results were called by telephone at the time of interpretation on 11/26/2011  at 0210 hours  to  Dr. Hyacinth Meeker in the emergency department, who verbally acknowledged these results.  Original Report Authenticated By: Danae Orleans, M.D.   Dg Chest Port 1 View  11/26/2011  *RADIOLOGY REPORT*  Clinical Data: Abdominal pain.  Alcohol abuse.  Perforated ulcer. Preop respiratory exam.  PORTABLE CHEST - 1 VIEW  Comparison: None.  Findings: Heart size is normal.  Both lungs are clear.  No evidence of pneumothorax or pleural effusion.  No mass or lymphadenopathy identified.  IMPRESSION: No active cardiopulmonary disease.  Original Report Authenticated By: Danae Orleans, M.D.    Assessment: s/p Procedure(s): EXPLORATORY LAPAROTOMY REPAIR OF PERFORATED ULCER Stable post op. Urine output a bit marginal, but should pick up today. Will leave foley in today to monitor urine  Plan: Transfer to floor, keep ng and npo, try  to increase activity   LOS: 2 days     Currie Paris, MD, [facility], [place] [phone]   11/27/2011 7:58 AM

## 2011-11-27 NOTE — ED Provider Notes (Signed)
Medical screening examination/treatment/procedure(s) were conducted as a shared visit with non-physician practitioner(s) and myself.  I personally evaluated the patient during the encounter  Please see my separate respective documentation pertaining to this patient encounter   Vida Roller, MD 11/27/11 1003

## 2011-11-27 NOTE — Progress Notes (Signed)
Patient transferring to room 1534.  Report called to Florentina Addison, Charity fundraiser.  Patient will travel by bed.  Will continue to monitor.

## 2011-11-28 NOTE — Progress Notes (Signed)
UR complete 

## 2011-11-28 NOTE — Progress Notes (Signed)
General Surgery [facility] - Attending  Patient seen and examined.  Clinical course reviewed.  Dressing changed this AM.  Await resolution of ileus.  Discussed with Dr. Abbey Chatters.  Will get UGI prior to removing NG tube.  Velora Heckler, MD, [facility] Office: [phone]

## 2011-11-28 NOTE — Progress Notes (Addendum)
Nurse entered Pt's room to administer meds and Pt informed nurse that her NG tube came out when she sneezed. Pt has been very fidgety and appears nervous so far this shift, c/o feeling restricted with her movements. She currently has a visitor in the room. Pt then attempted to place the NG tube in her nare with the nurse present in the room. Nurse informed Pt to stop. Spoke with CN and directed to replace NG tube.   2250: NG tube replaced in Rt nare, CN and nurse auscultated to confirm placement. NG reconnected to suction.

## 2011-11-28 NOTE — Progress Notes (Signed)
2 Days Post-Op  Subjective: No flatus, BM.  Using PCA for pain.  No real complaints.  NG was not working when I came in  [place] got it working and left instructions with nurse.  Objective: Vital signs in last 24 hours: Temp:  [98 F (36.7 C)-99.3 F (37.4 C)] 99.1 F (37.3 C) (04/15 0543) Pulse Rate:  [63-72] 63  (04/15 0543) Resp:  [14-24] 15  (04/15 0543) BP: (96-135)/(70-82) 118/73 mmHg (04/15 0543) SpO2:  [95 %-98 %] 96 % (04/15 0543) Last BM Date: 11/24/11 Temp 99 range, VSS, i/o is +2 liters yesterday, labs OK yesterday Intake/Output from previous day: 04/14 0701 - 04/15 0700 In: 3172.8 [I.V.:2912.8; IV Piggyback:260] Out: 1075 [Urine:1075] Intake/Output this shift:    General appearance: alert, cooperative and no distress Resp: clear to auscultation bilaterally GI: soft, no bowel sounds, no flatus, no distension.  Wound looks fine.  Wicks in place, not much drainage.  Lab Results:   Basename 11/27/11 0348 11/26/11 0150  WBC 10.9* 3.3*  HGB 11.7* 16.0*  HCT 35.1* 46.3*  PLT 183 251    BMET  Basename 11/27/11 0348 11/26/11 0150  NA 134* 136  K 4.3 4.8  CL 108 96  CO2 27 27  GLUCOSE 128* 97  BUN 9 7  CREATININE 0.70 0.70  CALCIUM 7.8* 8.7   PT/INR No results found for this basename: LABPROT:2,INR:2 in the last 72 hours   Lab 11/26/11 0150  AST 44*  ALT 22  ALKPHOS 63  BILITOT 0.7  PROT 6.5  ALBUMIN 3.3*     Lipase     Component Value Date/Time   LIPASE 34 11/26/2011 0150     Studies/Results: No results found.  Medications:    . antiseptic oral rinse  15 mL Mouth Rinse q12n4p  . chlorhexidine  15 mL Mouth Rinse BID  . Chlorhexidine Gluconate Cloth  6 each Topical Q0600  . heparin  5,000 Units Subcutaneous Q8H  . morphine   Intravenous Q4H  . mupirocin ointment  1 application Nasal BID  . pantoprazole (PROTONIX) IV  40 mg Intravenous Q12H  . piperacillin-tazobactam (ZOSYN)  IV  3.375 g Intravenous Q8H  . thiamine  100 mg Oral Daily   Or   . thiamine  100 mg Intravenous Daily    Assessment/Plan Anterior gastric perforation-prepyloric, s/p Emergency exploratory laparotomy, biopsy of anterior gastric perforation followed by primary closure and omental patching 11/26/11 Dr. Abbey Chatters.  POD2 Hx ETOH/ +cocaine and MJ on admission (2-40's per day at home Denies any other medical issues; ON SIWA protocol.  ? Malnutrition Plan:  Continue NG drainage, SIWA protocol, mobilize, she is doing IS, on Heparin for DVT, d/c foley today.recheck labs tomorrow. We will get UGI prior to starting PO's.    LOS: 3 days    Gabrielle Davis 11/28/2011

## 2011-11-29 LAB — COMPREHENSIVE METABOLIC PANEL
AST: 29 U/L (ref 0–37)
Albumin: 2 g/dL — ABNORMAL LOW (ref 3.5–5.2)
BUN: 6 mg/dL (ref 6–23)
Calcium: 8.3 mg/dL — ABNORMAL LOW (ref 8.4–10.5)
Creatinine, Ser: 0.69 mg/dL (ref 0.50–1.10)
Total Bilirubin: 0.4 mg/dL (ref 0.3–1.2)
Total Protein: 5.1 g/dL — ABNORMAL LOW (ref 6.0–8.3)

## 2011-11-29 LAB — CBC
HCT: 32.4 % — ABNORMAL LOW (ref 36.0–46.0)
MCH: 33.2 pg (ref 26.0–34.0)
MCHC: 32.7 g/dL (ref 30.0–36.0)
MCV: 101.6 fL — ABNORMAL HIGH (ref 78.0–100.0)
Platelets: 159 10*3/uL (ref 150–400)
RDW: 14.3 % (ref 11.5–15.5)

## 2011-11-29 NOTE — Progress Notes (Signed)
General Surgery [facility] - Attending  Patient seen and examined.  Doing well.  NG with moderate bilious output.  If ileus appears to be resolving in AM 4/17, will get gastrograffin UGI via NG tube to evaluate [facility] patch closure.  If intact, may discontinue NG on 4/17 and begin CL diet.  Velora Heckler, MD, [facility] Office: [phone]

## 2011-11-29 NOTE — Progress Notes (Signed)
Nurse entered Pt's room to respond to IV alarms. Pt was noted in the bathroom with door closed and IV pole still at the bedside with the tubes and lines stretched to their max. NG suction tube was noted to be disconnected and lying on the floor. NT opened bathroom door and noticed Pt sitting on commode with NG tube on the floor. Pt appeared upset she had to maneuver in room while having to deal with lines and tubes. She continue to proclaim the NG tube fell out on its own, and that she tried to replace it herself. Pt visitor was in room asleep in the chair. Pt returned to bed, NG tube was attempted to be replaced but Pt refused saying her nose was sore and she did not want to do it again. Pt was also arguing with her visitor a lot when he awakened, he left the room several times and returned. The Pt maintained a calm demeanor but her mind seemed unclear. She had a complete disregard for her environment and the current situation. She also appeared to be very unsteady and unstable on her feet. Pt has no PRN for agitation or anxiety, she was situated in her room, bed alarm, turned on and report given to the oncoming nurse to follow up.

## 2011-11-29 NOTE — Progress Notes (Signed)
3 Days Post-Op  Subjective: NG out yesterday, getting pills from [facility] protocol even though she is NPO. NO flatus so far.  Objective: Vital signs in last 24 hours: Temp:  [97.7 F (36.5 C)-99.4 F (37.4 C)] 97.7 F (36.5 C) (04/16 0506) Pulse Rate:  [61-70] 70  (04/16 0900) Resp:  [15-21] 18  (04/16 0721) BP: (109-140)/(70-87) 138/70 mmHg (04/16 0900) SpO2:  [96 %-100 %] 98 % (04/16 0721) FiO2 (%):  [39 %-42 %] 39 % (04/16 0721) Last BM Date: 11/24/11 Ng down to 100 listed yesterday 360 Po, afebrile,  VSS Intake/Output from previous day: 04/15 0701 - 04/16 0700 In: 2563.6 [P.O.:360; I.V.:2068.6; IV Piggyback:135] Out: 1150 [Urine:1050; Emesis/NG output:100] Intake/Output this shift: Total I/O In: 9 [I.V.:9] Out: -   General appearance: alert, cooperative and no distress Resp: clear to auscultation bilaterally GI: Ng falling out again, suction off for thiamine and MVI.few bowel sounds,no flatus.  Lab Results:   Basename 11/29/11 0421 11/27/11 0348  WBC 5.0 10.9*  HGB 10.6* 11.7*  HCT 32.4* 35.1*  PLT 159 183    BMET  Basename 11/29/11 0421 11/27/11 0348  NA 136 134*  K 3.7 4.3  CL 104 108  CO2 22 27  GLUCOSE 87 128*  BUN 6 9  CREATININE 0.69 0.70  CALCIUM 8.3* 7.8*   PT/INR No results found for this basename: LABPROT:2,INR:2 in the last 72 hours   Lab 11/29/11 0421 11/26/11 0150  AST 29 44*  ALT 14 22  ALKPHOS 50 63  BILITOT 0.4 0.7  PROT 5.1* 6.5  ALBUMIN 2.0* 3.3*     Lipase     Component Value Date/Time   LIPASE 34 11/26/2011 0150     Studies/Results: No results found.  Medications:    . antiseptic oral rinse  15 mL Mouth Rinse q12n4p  . chlorhexidine  15 mL Mouth Rinse BID  . Chlorhexidine Gluconate Cloth  6 each Topical Q0600  . heparin  5,000 Units Subcutaneous Q8H  . morphine   Intravenous Q4H  . mupirocin ointment  1 application Nasal BID  . pantoprazole (PROTONIX) IV  40 mg Intravenous Q12H  . piperacillin-tazobactam (ZOSYN)  IV   3.375 g Intravenous Q8H  . thiamine  100 mg Oral Daily   Or  . thiamine  100 mg Intravenous Daily    Assessment/Plan Anterior gastric perforation-prepyloric, s/p Emergency exploratory laparotomy, biopsy of anterior gastric perforation followed by primary closure and omental patching 11/26/11 Dr. Abbey Chatters. POD2  Hx ETOH/ +cocaine and MJ on admission (2-40's per day at home  Denies any other medical issues; ON SIWA protocol.  ? Malnutrition    Plan:  re secure the NG, continue suction, hold PO meds.  Prealbumin is 10.7.  Continue NPO for now, but if she doesn't open soon consider TNA,      LOS: 4 days    Gabrielle Davis 11/29/2011

## 2011-11-30 ENCOUNTER — Inpatient Hospital Stay (HOSPITAL_COMMUNITY): Payer: Self-pay

## 2011-11-30 MED ORDER — MAGIC MOUTHWASH
15.0000 mL | Freq: Four times a day (QID) | ORAL | Status: DC | PRN
  Administered 2011-12-02: 15 mL via ORAL
  Filled 2011-11-30: qty 15

## 2011-11-30 MED ORDER — PHENOL 1.4 % MT LIQD: 2.0000 | OROMUCOSAL | Status: DC | PRN

## 2011-11-30 MED ORDER — LIP MEDEX EX OINT
1.0000 "application " | TOPICAL_OINTMENT | Freq: Two times a day (BID) | CUTANEOUS | Status: DC
  Administered 2011-11-30 – 2011-12-07 (×16): 1 via TOPICAL
  Filled 2011-11-30 (×3): qty 7

## 2011-11-30 MED ORDER — IOHEXOL 300 MG/ML  SOLN
150.0000 mL | Freq: Once | INTRAMUSCULAR | Status: AC | PRN
  Administered 2011-11-30: 150 mL

## 2011-11-30 MED ORDER — BISACODYL 10 MG RE SUPP: 10.0000 mg | Freq: Two times a day (BID) | RECTAL | Status: DC | PRN

## 2011-11-30 MED ORDER — MENTHOL 3 MG MT LOZG: 1.0000 | LOZENGE | OROMUCOSAL | Status: DC | PRN

## 2011-11-30 NOTE — Progress Notes (Signed)
120cc air inserted in both blue and clear ports. Small amount of clear secretions w/ minimal bright red blood noted in tubing

## 2011-11-30 NOTE — Progress Notes (Signed)
NG tube 16 gauze inserted in right nare w/ minimal difficulty.  Tube checked for placement.  No xray ordered. Instructed pt to call for assistance. Bed alarm on.

## 2011-11-30 NOTE — Progress Notes (Signed)
4 Days Post-Op  Subjective: NG came out again last PM about MN, some pt confusion, some conflict with visitor at that time also. NG replaced about 1AM Says she had a soft BM this AM and flatus Objective: Vital signs in last 24 hours: Temp:  [98.6 F (37 C)-99.6 F (37.6 C)] 99.2 F (37.3 C) (04/17 0546) Pulse Rate:  [55-75] 64  (04/17 0546) Resp:  [11-24] 21  (04/17 0829) BP: (117-170)/(65-91) 132/85 mmHg (04/17 0546) SpO2:  [96 %-100 %] 99 % (04/17 0829) Last BM Date: 11/30/11 TM 99.2,BP up once yesterday. Labs OK Intake/Output from previous day: 04/16 0701 - 04/17 0700 In: 3132.2 [I.V.:2972.2; NG/GT:60; IV Piggyback:100] Out: 800 [Urine:100; Emesis/NG output:700] Intake/Output this shift:    General appearance: alert and appears stated age Resp: clear to auscultation bilaterally GI: soft, non-tender; bowel sounds normal; no masses,  no organomegaly and incidion OK  Lab Results:   Basename 11/29/11 0421  WBC 5.0  HGB 10.6*  HCT 32.4*  PLT 159    BMET  Basename 11/29/11 0421  NA 136  K 3.7  CL 104  CO2 22  GLUCOSE 87  BUN 6  CREATININE 0.69  CALCIUM 8.3*   PT/INR No results found for this basename: LABPROT:2,INR:2 in the last 72 hours   Lab 11/29/11 0421 11/26/11 0150  AST 29 44*  ALT 14 22  ALKPHOS 50 63  BILITOT 0.4 0.7  PROT 5.1* 6.5  ALBUMIN 2.0* 3.3*     Lipase     Component Value Date/Time   LIPASE 34 11/26/2011 0150     Studies/Results: No results found.  Medications:    . antiseptic oral rinse  15 mL Mouth Rinse q12n4p  . chlorhexidine  15 mL Mouth Rinse BID  . Chlorhexidine Gluconate Cloth  6 each Topical Q0600  . heparin  5,000 Units Subcutaneous Q8H  . lip balm  1 application Topical BID  . morphine   Intravenous Q4H  . mupirocin ointment  1 application Nasal BID  . pantoprazole (PROTONIX) IV  40 mg Intravenous Q12H  . piperacillin-tazobactam (ZOSYN)  IV  3.375 g Intravenous Q8H  . thiamine  100 mg Intravenous Daily     Assessment/Plan Anterior gastric perforation-prepyloric, s/p Emergency exploratory laparotomy, biopsy of anterior gastric perforation followed by primary closure and omental patching 11/26/11 Gabrielle Davis. POD2  Hx ETOH/ +cocaine and MJ on admission (2-40's per day at home  Denies any other medical issues; ON SIWA protocol.  ? Malnutrition   Plan: UGI TODAY   LOS: 5 days    Gabrielle Davis 11/30/2011

## 2011-11-30 NOTE — Progress Notes (Signed)
General Surgery [facility] - Attending  Patient had UGI performed.  Images reviewed.  Final report from radiology pending.  If no evidence of leak, will advance to clear liquid diet.  NG already out.  Velora Heckler, MD, [facility] Office: [phone]

## 2011-12-01 NOTE — Progress Notes (Signed)
General Surgery [facility] - Attending  NG removed.  Start CL diet.  Velora Heckler, MD, [facility] Office: [phone]

## 2011-12-01 NOTE — Progress Notes (Signed)
UR complete 

## 2011-12-01 NOTE — Progress Notes (Signed)
5 Days Post-Op  Subjective: +BM yesterday, none today, UGI shows no leak, we pulled NG and started sips.   Objective: Vital signs in last 24 hours: Temp:  [98.2 F (36.8 C)-99 F (37.2 C)] 98.2 F (36.8 C) (04/18 1000) Pulse Rate:  [51-65] 56  (04/18 1000) Resp:  [12-22] 21  (04/18 1201) BP: (125-147)/(68-79) 125/75 mmHg (04/18 1000) SpO2:  [95 %-100 %] 96 % (04/18 1201) Last BM Date: 11/30/11 Tm 99.2, vss,  Intake/Output from previous day: 04/17 0701 - 04/18 0700 In: -  Out: 900 [Urine:900] Intake/Output this shift: Total I/O In: -  Out: 500 [Urine:500]  General appearance: alert, cooperative and no distress Resp: clear to auscultation bilaterally GI: distended some this AM.  Wound looks good, I took out the wick.  Not allot of flatus since BM yesterday.  Lab Results:   [facility] 11/29/11 0421  WBC 5.0  HGB 10.6*  HCT 32.4*  PLT 159    BMET  Basename 11/29/11 0421  NA 136  K 3.7  CL 104  CO2 22  GLUCOSE 87  BUN 6  CREATININE 0.69  CALCIUM 8.3*   PT/INR No results found for this basename: LABPROT:2,INR:2 in the last 72 hours   Lab 11/29/11 0421 11/26/11 0150  AST 29 44*  ALT 14 22  ALKPHOS 50 63  BILITOT 0.4 0.7  PROT 5.1* 6.5  ALBUMIN 2.0* 3.3*     Lipase     Component Value Date/Time   LIPASE 34 11/26/2011 0150     Studies/Results: Dg Ugi W/water Sol Cm  11/30/2011  *RADIOLOGY REPORT*  Clinical Data:  Gastric perforation repair 11/26/2011.  Evaluate for leak.  UPPER GI SERIES WITH KUB  Technique:  Routine upper GI series was performed with 150 ml Omnipaque-300.  Fluoroscopy Time: 1.02 minutes  Comparison:  CT abdomen pelvis 11/26/2011.  Findings: Scout view of the abdomen shows a nasogastric tube terminating in the stomach.  Mild gaseous prominence of colon.  No definite free air.  Small right pleural effusion with bibasilar air space disease.  150 ml Omnipaque-300 hand injected through the nasogastric tube shows no evidence of a gastric leak.   Contrast flows into the duodenum.  IMPRESSION: No evidence of a gastric leak.  Original Report Authenticated By: Reyes Ivan, M.D.    Medications:    . antiseptic oral rinse  15 mL Mouth Rinse q12n4p  . chlorhexidine  15 mL Mouth Rinse BID  . Chlorhexidine Gluconate Cloth  6 each Topical Q0600  . heparin  5,000 Units Subcutaneous Q8H  . lip balm  1 application Topical BID  . morphine   Intravenous Q4H  . mupirocin ointment  1 application Nasal BID  . pantoprazole (PROTONIX) IV  40 mg Intravenous Q12H  . piperacillin-tazobactam (ZOSYN)  IV  3.375 g Intravenous Q8H  . thiamine  100 mg Intravenous Daily    Assessment/Plan Anterior gastric perforation-prepyloric, s/p Emergency exploratory laparotomy, biopsy of anterior gastric perforation followed by primary closure and omental patching 11/26/11 Dr. Abbey Chatters. POD2  Hx ETOH/ +cocaine and MJ on admission (2-40's per day at home)  Denies any other medical issues; ON SIWA protocol.  ? Malnutrition   Plan:  Sips of clears, mobilize.  Transition her to PO pain meds when she's tolerating PO's well.      LOS: 6 days    Keita Demarco 12/01/2011

## 2011-12-02 MED ORDER — OXYCODONE-ACETAMINOPHEN 5-325 MG PO TABS
1.0000 | ORAL_TABLET | ORAL | Status: DC | PRN
  Administered 2011-12-02 – 2011-12-07 (×17): 2 via ORAL
  Filled 2011-12-02: qty 1
  Filled 2011-12-02 (×18): qty 2

## 2011-12-02 MED ORDER — ADULT MULTIVITAMIN W/MINERALS CH
1.0000 | ORAL_TABLET | Freq: Every day | ORAL | Status: DC
  Administered 2011-12-02 – 2011-12-07 (×6): 1 via ORAL
  Filled 2011-12-02 (×6): qty 1

## 2011-12-02 MED ORDER — ACETAMINOPHEN 10 MG/ML IV SOLN
1000.0000 mg | Freq: Four times a day (QID) | INTRAVENOUS | Status: AC
  Administered 2011-12-02 (×2): 1000 mg via INTRAVENOUS
  Filled 2011-12-02 (×4): qty 100

## 2011-12-02 MED ORDER — MORPHINE SULFATE 2 MG/ML IJ SOLN
1.0000 mg | INTRAMUSCULAR | Status: DC | PRN
Start: 2011-12-02 — End: 2011-12-07
  Administered 2011-12-03 – 2011-12-05 (×5): 2 mg via INTRAVENOUS
  Filled 2011-12-02 (×5): qty 1

## 2011-12-02 NOTE — Progress Notes (Signed)
INITIAL ADULT NUTRITION ASSESSMENT Date: 12/02/2011   Time: 2:27 PM Reason for Assessment: NPO/clear liquids x 7 days  Food/Nutrition Related Hx: Pt reports poor intake for the past 3 weeks and states her food intake at home was mostly snacks on/off throughout the day, no meals. Pt admits to drinking between 2-3 40 ounce bottles of beer/day. Pt denied wanting any help for this alcohol abuse stating "I've come out of it before, I can do it again". Pt unable to state what started the alcohol abuse again recently stating "I'm sure there was something that happened that cause me to drink, but I don't remember what it was". Pt reports her usual weight is 117 pounds and has lost 17 pounds in the past 3 weeks. Pt admitted with abdominal pain for the past 5 days with chronic heavy alcohol use. CT of abdomen/pelvis showed perforated gastric ulcer. POD # 6 emergency exploratory laparotomy with biopsy and repair of gastric perforation. There were some issues with NGT coming out and needing to be replaced but it was removed on 4/17. Pt tolerating clear liquid diet without nausea and had 2 BM's yesterday. Pt with abdominal distention with plans to keep on clear liquid diet now and hopefully advance diet as tolerated soon.   CT of abdomen/pelvis on 11/26/11 showed: 1. Mild pneumoperitoneum, with wall thickening of the gastric body  and antrum. This suggests perforated gastric ulcer as the etiology  of the pneumoperitoneum.  2. Generalized mesenteric edema and mild to moderate pelvic  ascites.  Upper GI series on 11/30/11 showed: No evidence of a gastric leak.   ASSESSMENT: Female 51 y.o.  Dx: Severe upper abdominal pain  Hx:  Past Medical History  Diagnosis Date  . ETOH abuse    Related Meds:  Scheduled Meds:   . acetaminophen  1,000 mg Intravenous Q6H  . antiseptic oral rinse  15 mL Mouth Rinse q12n4p  . chlorhexidine  15 mL Mouth Rinse BID  . heparin  5,000 Units Subcutaneous Q8H  . lip balm  1  application Topical BID  . pantoprazole (PROTONIX) IV  40 mg Intravenous Q12H  . piperacillin-tazobactam (ZOSYN)  IV  3.375 g Intravenous Q8H  . thiamine  100 mg Intravenous Daily  . DISCONTD: morphine   Intravenous Q4H   Continuous Infusions:   . dextrose 5 % and 0.9 % NaCl with KCl 20 mEq/L 1,000 mL (12/02/11 0200)   PRN Meds:.bisacodyl, magic mouthwash, menthol-cetylpyridinium, morphine injection, ondansetron, oxyCODONE-acetaminophen, phenol, DISCONTD: diphenhydrAMINE, DISCONTD: diphenhydrAMINE, DISCONTD: naloxone, DISCONTD: ondansetron (ZOFRAN) IV, DISCONTD: sodium chloride  Ht: 5\' 7"  (170.2 cm)  Wt: 100 lb 5 oz (45.5 kg)  Ideal Wt: 135 lb % Ideal Wt: 74  Usual Wt: 117 lb per pt report  % Usual Wt: 85  Body mass index is 15.71 kg/(m^2).  Labs:  CMP     Component Value Date/Time   NA 136 11/29/2011 0421   K 3.7 11/29/2011 0421   CL 104 11/29/2011 0421   CO2 22 11/29/2011 0421   GLUCOSE 87 11/29/2011 0421   BUN 6 11/29/2011 0421   CREATININE 0.69 11/29/2011 0421   CALCIUM 8.3* 11/29/2011 0421   PROT 5.1* 11/29/2011 0421   ALBUMIN 2.0* 11/29/2011 0421   AST 29 11/29/2011 0421   ALT 14 11/29/2011 0421   ALKPHOS 50 11/29/2011 0421   BILITOT 0.4 11/29/2011 0421   GFRNONAA >90 11/29/2011 0421   GFRAA >90 11/29/2011 0421    Intake/Output Summary (Last 24 hours) at 12/02/11 1443 Last data  filed at 12/02/11 1209  Gross per 24 hour  Intake   1560 ml  Output   1600 ml  Net    -40 ml   Last BM - 11/30/11  Diet Order: Clear Liquid  IVF:    dextrose 5 % and 0.9 % NaCl with KCl 20 mEq/L Last Rate: 1,000 mL (12/02/11 0200)    Estimated Nutritional Needs:   Kcal:1650-1850 Protein:70-85g Fluid:1.6-1.8L  NUTRITION DIAGNOSIS: -Inadequate oral intake (NI-2.1).  Status: Ongoing -Pt meets criteria for severe PCM of chronic illness AEB 14.5% weight loss in the past 3 weeks per pt report and <75% energy intake for the past month in addition to pt with thin extremities  RELATED TO:  abdominal distention   AS EVIDENCE BY: clear liquid   MONITORING/EVALUATION(Goals): Advance diet as tolerated to bland diet.   EDUCATION NEEDS: -No education needs identified at this time  INTERVENTION: Diet advancement per MD. Multivitamin 1 tablet by mouth daily for alcohol abuse. Will monitor.   Dietitian #: [phone]  DOCUMENTATION CODES Per approved criteria  -Severe malnutrition in the context of chronic illness -Underweight    Marshall Cork 12/02/2011, 2:27 PM

## 2011-12-02 NOTE — Progress Notes (Signed)
6 Days Post-Op  Subjective: She is feeling better, still a little bloated, no nausea.  Taking clears, but slowly. 2BM's recorded yesterday.  Objective: Vital signs in last 24 hours: Temp:  [98.5 F (36.9 C)-99.2 F (37.3 C)] 98.8 F (37.1 C) (04/19 0600) Pulse Rate:  [55-64] 63  (04/19 0600) Resp:  [16-23] 23  (04/19 0800) BP: (121-143)/(76-97) 121/78 mmHg (04/19 0600) SpO2:  [95 %-98 %] 96 % (04/19 0800) FiO2 (%):  [36 %] 36 % (04/19 0800) Last BM Date: 11/30/11  Intake/Output from previous day: 04/18 0701 - 04/19 0700 In: 2772 [I.V.:2512; IV Piggyback:260] Out: 1750 [Urine:1750] Intake/Output this shift: Total I/O In: -  Out: 350 [Urine:350]  General appearance: alert, cooperative and no distress Resp: clear to auscultation bilaterally GI: soft, distended, few BS, + BM, wound looks good.  Lab Results:  No results found for this basename: WBC:2,HGB:2,HCT:2,PLT:2 in the last 72 hours  BMET No results found for this basename: NA:2,K:2,CL:2,CO2:2,GLUCOSE:2,BUN:2,CREATININE:2,CALCIUM:2 in the last 72 hours PT/INR No results found for this basename: LABPROT:2,INR:2 in the last 72 hours   Lab 11/29/11 0421 11/26/11 0150  AST 29 44*  ALT 14 22  ALKPHOS 50 63  BILITOT 0.4 0.7  PROT 5.1* 6.5  ALBUMIN 2.0* 3.3*     Lipase     Component Value Date/Time   LIPASE 34 11/26/2011 0150     Studies/Results: Dg Ugi W/water Sol Cm  11/30/2011  *RADIOLOGY REPORT*  Clinical Data:  Gastric perforation repair 11/26/2011.  Evaluate for leak.  UPPER GI SERIES WITH KUB  Technique:  Routine upper GI series was performed with 150 ml Omnipaque-300.  Fluoroscopy Time: 1.02 minutes  Comparison:  CT abdomen pelvis 11/26/2011.  Findings: Scout view of the abdomen shows a nasogastric tube terminating in the stomach.  Mild gaseous prominence of colon.  No definite free air.  Small right pleural effusion with bibasilar air space disease.  150 ml Omnipaque-300 hand injected through the nasogastric  tube shows no evidence of a gastric leak.  Contrast flows into the duodenum.  IMPRESSION: No evidence of a gastric leak.  Original Report Authenticated By: Reyes Ivan, M.D.    Medications:    . antiseptic oral rinse  15 mL Mouth Rinse q12n4p  . chlorhexidine  15 mL Mouth Rinse BID  . heparin  5,000 Units Subcutaneous Q8H  . lip balm  1 application Topical BID  . morphine   Intravenous Q4H  . pantoprazole (PROTONIX) IV  40 mg Intravenous Q12H  . piperacillin-tazobactam (ZOSYN)  IV  3.375 g Intravenous Q8H  . thiamine  100 mg Intravenous Daily    Assessment/Plan Anterior gastric perforation-prepyloric, s/p Emergency exploratory laparotomy, biopsy of anterior gastric perforation followed by primary closure and omental patching 11/26/11 Dr. Abbey Chatters. POD2  Hx ETOH/ +cocaine and MJ on admission (2-40's per day at home)  Denies any other medical issues; ON SIWA protocol.  ? Malnutrition    Plan:  We will advance her diet as she tolerates it.  Right now she's still distended, so keep on clears for now.  She is walking wound looks good, d/c PCA.     LOS: 7 days    Dyson Sevey 12/02/2011

## 2011-12-02 NOTE — Progress Notes (Signed)
General Surgery [facility] - Attending  Patient tolerating CL diet.  No nausea.  Needs to ambulate.  Velora Heckler, MD, [facility] Office: [phone]

## 2011-12-03 ENCOUNTER — Inpatient Hospital Stay (HOSPITAL_COMMUNITY): Payer: Self-pay

## 2011-12-03 LAB — CBC
HCT: 29.7 % — ABNORMAL LOW (ref 36.0–46.0)
Hemoglobin: 10.1 g/dL — ABNORMAL LOW (ref 12.0–15.0)
MCV: 96.7 fL (ref 78.0–100.0)
RBC: 3.07 MIL/uL — ABNORMAL LOW (ref 3.87–5.11)
WBC: 6.9 10*3/uL (ref 4.0–10.5)

## 2011-12-03 LAB — BASIC METABOLIC PANEL
BUN: <3 mg/dL — ABNORMAL LOW (ref 6–23)
CO2: 23 mEq/L (ref 19–32)
Chloride: 106 mEq/L (ref 96–112)
Creatinine, Ser: 0.69 mg/dL (ref 0.50–1.10)
Glucose, Bld: 105 mg/dL — ABNORMAL HIGH (ref 70–99)

## 2011-12-03 LAB — MAGNESIUM: Magnesium: 1.6 mg/dL (ref 1.5–2.5)

## 2011-12-03 MED ORDER — BOOST / RESOURCE BREEZE PO LIQD
1.0000 | Freq: Three times a day (TID) | ORAL | Status: DC
  Administered 2011-12-03 – 2011-12-07 (×10): 1 via ORAL

## 2011-12-03 NOTE — Progress Notes (Signed)
7 Days Post-Op  Subjective: +BM and flatus, tolerating clears  Objective: Vital signs in last 24 hours: Temp:  [97.6 F (36.4 C)-98.7 F (37.1 C)] 98.7 F (37.1 C) (04/20 0644) Pulse Rate:  [58-77] 59  (04/20 0644) Resp:  [18-23] 18  (04/20 0644) BP: (125-136)/(75-81) 125/81 mmHg (04/20 0644) SpO2:  [92 %-98 %] 92 % (04/20 0644) Last BM Date: 11/30/11  Intake/Output from previous day: 04/19 0701 - 04/20 0700 In: 3397.9 [I.V.:3047.9; IV Piggyback:350] Out: 650 [Urine:650] Intake/Output this shift:    General appearance: alert, cooperative and no distress Resp: clear to auscultation bilaterally Cardio: regular rate and rhythm, S1, S2 normal, no murmur, click, rub or gallop GI: soft, no significant tenderness, moderate distension, wound erythematous and wound explored in 2 areas.  some hematoma evacuated and 2 areas packed with 1/2 in gauze  Lab Results:   Basename 12/03/11 0429  WBC 6.9  HGB 10.1*  HCT 29.7*  PLT 304   BMET  Basename 12/03/11 0429  NA 136  K 3.4*  CL 106  CO2 23  GLUCOSE 105*  BUN <3*  CREATININE 0.69  CALCIUM 7.9*   PT/INR No results found for this basename: LABPROT:2,INR:2 in the last 72 hours ABG No results found for this basename: PHART:2,PCO2:2,PO2:2,HCO3:2 in the last 72 hours  Studies/Results: No results found.  Anti-infectives: Anti-infectives     Start     Dose/Rate Route Frequency Ordered Stop   11/26/11 1100   piperacillin-tazobactam (ZOSYN) IVPB 3.375 g        3.375 g 12.5 mL/hr over 240 Minutes Intravenous Every 8 hours 11/26/11 0554     11/26/11 0230   piperacillin-tazobactam (ZOSYN) IVPB 3.375 g        3.375 g 12.5 mL/hr over 240 Minutes Intravenous  Once 11/26/11 0218 11/26/11 0704          Assessment/Plan: s/p Procedure(s) (LRB): EXPLORATORY LAPAROTOMY (N/A) REPAIR OF PERFORATED ULCER (N/A) she seems to be tolerating clears but her abdomen is distended and I do not think that we can advance diet further.  her  wound also looks pink and has some blanching erythema.  I packed 2 portions of the wound and so we will follow this to see if any improvement.  Will check some abdominal xrays  LOS: 8 days    Lodema Pilot DAVID 12/03/2011

## 2011-12-04 MED ORDER — PANTOPRAZOLE SODIUM 40 MG PO TBEC
40.0000 mg | DELAYED_RELEASE_TABLET | Freq: Two times a day (BID) | ORAL | Status: DC
  Administered 2011-12-04 – 2011-12-07 (×8): 40 mg via ORAL
  Filled 2011-12-04 (×7): qty 1

## 2011-12-04 MED ORDER — VITAMIN B-1 100 MG PO TABS
100.0000 mg | ORAL_TABLET | Freq: Every day | ORAL | Status: DC
  Administered 2011-12-04 – 2011-12-07 (×4): 100 mg via ORAL
  Filled 2011-12-04 (×4): qty 1

## 2011-12-04 NOTE — Progress Notes (Signed)
The patient is receiving Protonix & Thiamine by the intravenous route. Based on criteria approved by the Pharmacy and Therapeutics Committee and the Medical Executive Committee, the medication is being converted to the equivalent oral dose form.   These criteria include:  -No Active GI bleeding  -Able to tolerate diet of full liquids (or better) or tube feeding  -Able to tolerate other medications by the oral or enteral route   If you have any questions about this conversion, please contact the Pharmacy Department (ext [phone] Thank you.  Loralee Pacas, PharmD, BCPS

## 2011-12-04 NOTE — Progress Notes (Signed)
8 Days Post-Op  Subjective: No complaints.  Tolerating diet, +flatus and BM.  xrays unremarkable  Objective: Vital signs in last 24 hours: Temp:  [98.6 F (37 C)-99.3 F (37.4 C)] 98.6 F (37 C) (04/21 0607) Pulse Rate:  [54-59] 54  (04/21 0607) Resp:  [18] 18  (04/21 0607) BP: (135-141)/(78-89) 141/78 mmHg (04/21 0607) SpO2:  [92 %-96 %] 92 % (04/21 0607) Last BM Date: 11/30/11  Intake/Output from previous day: Dec 11, 2022 0701 - 04/21 0700 In: 3092.9 [P.O.:480; I.V.:2432.9; NG/GT:30; IV Piggyback:150] Out: 700 [Urine:700] Intake/Output this shift:    General appearance: alert, cooperative and no distress Resp: clear to auscultation bilaterally Cardio: regular rate and rhythm GI: soft, nt, incision okay, still slight erythema near inferior portion of wound, less distended  Lab Results:   [facility] Dec 11, 2011 0429  WBC 6.9  HGB 10.1*  HCT 29.7*  PLT 304   BMET  Basename 11-Dec-2011 0429  NA 136  K 3.4*  CL 106  CO2 23  GLUCOSE 105*  BUN <3*  CREATININE 0.69  CALCIUM 7.9*   PT/INR No results found for this basename: LABPROT:2,INR:2 in the last 72 hours ABG No results found for this basename: PHART:2,PCO2:2,PO2:2,HCO3:2 in the last 72 hours  Studies/Results: Dg Abd 2 Views  2011/12/11  *RADIOLOGY REPORT*  Clinical Data: Abdominal distension  ABDOMEN - 2 VIEW  Comparison: CT of the abdomen pelvis - 11/26/2011; upper GI series - 11/30/2011  Findings:  Mild gaseous distension of multiple loops of large and small bowel within the left mid hemiabdomen without definite evidence of obstruction.  No definite pneumoperitoneum, pneumatosis or portal venous gas.  Skin staples overlying the midline of the abdomen. Limited visualization of the lower thorax demonstrates mild elevation of the right hemidiaphragm with bibasilar heterogeneous opacities.  Unchanged bones.  IMPRESSION: Nonobstructive bowel gas pattern.  Original Report Authenticated By: Waynard Reeds, M.D.     Anti-infectives: Anti-infectives     Start     Dose/Rate Route Frequency Ordered Stop   11/26/11 1100   piperacillin-tazobactam (ZOSYN) IVPB 3.375 g        3.375 g 12.5 mL/hr over 240 Minutes Intravenous Every 8 hours 11/26/11 0554     11/26/11 0230   piperacillin-tazobactam (ZOSYN) IVPB 3.375 g        3.375 g 12.5 mL/hr over 240 Minutes Intravenous  Once 11/26/11 0218 11/26/11 0704          Assessment/Plan: s/p Procedure(s) (LRB): EXPLORATORY LAPAROTOMY (N/A) REPAIR OF PERFORATED ULCER (N/A) Advance diet, continue wound care  LOS: 9 days    Lodema Pilot DAVID 12/04/2011

## 2011-12-05 ENCOUNTER — Inpatient Hospital Stay (HOSPITAL_COMMUNITY): Payer: Self-pay

## 2011-12-05 NOTE — Progress Notes (Signed)
Slightly distended Some diarrhea yesterday - C.diff pending. Will check plain films today.  [name]. [name], MD, [facility]  12/05/2011 10:03 AM

## 2011-12-05 NOTE — Progress Notes (Signed)
Agree.  Hopefully home soon.  [name]. [name], MD, [facility]  12/05/2011 2:07 PM

## 2011-12-05 NOTE — Progress Notes (Addendum)
9 Days Post-Op  Subjective: 3 loose BM's since yesterday, abd still distended.  Objective: Vital signs in last 24 hours: Temp:  [98.8 F (37.1 C)-99.3 F (37.4 C)] 99.3 F (37.4 C) (04/22 0611) Pulse Rate:  [54-58] 58  (04/22 0611) Resp:  [18] 18  (04/22 0611) BP: (130-153)/(76-80) 130/78 mmHg (04/22 0611) SpO2:  [90 %-97 %] 90 % (04/22 0611) Last BM Date: 12/04/11 TM 99.3, VSS, no labs, regular diet, Pt. Now reporting loose stools, Intake/Output from previous day: 04/21 0701 - 04/22 0700 In: 1110 [P.O.:960; IV Piggyback:150] Out: 800 [Urine:800] Intake/Output this shift:    General appearance: alert, cooperative and no distress Resp: clear to auscultation bilaterally GI: distended some. wounds OK had some diarrhea after eating .  Wounds OK  Lab Results:   [facility] 12-31-11 0429  WBC 6.9  HGB 10.1*  HCT 29.7*  PLT 304    BMET  Basename Dec 31, 2011 0429  NA 136  K 3.4*  CL 106  CO2 23  GLUCOSE 105*  BUN <3*  CREATININE 0.69  CALCIUM 7.9*   PT/INR No results found for this basename: LABPROT:2,INR:2 in the last 72 hours   Lab 11/29/11 0421  AST 29  ALT 14  ALKPHOS 50  BILITOT 0.4  PROT 5.1*  ALBUMIN 2.0*     Lipase     Component Value Date/Time   LIPASE 34 11/26/2011 0150     Studies/Results: Dg Abd 2 Views  Dec 31, 2011  *RADIOLOGY REPORT*  Clinical Data: Abdominal distension  ABDOMEN - 2 VIEW  Comparison: CT of the abdomen pelvis - 11/26/2011; upper GI series - 11/30/2011  Findings:  Mild gaseous distension of multiple loops of large and small bowel within the left mid hemiabdomen without definite evidence of obstruction.  No definite pneumoperitoneum, pneumatosis or portal venous gas.  Skin staples overlying the midline of the abdomen. Limited visualization of the lower thorax demonstrates mild elevation of the right hemidiaphragm with bibasilar heterogeneous opacities.  Unchanged bones.  IMPRESSION: Nonobstructive bowel gas pattern.  Original Report  Authenticated By: Waynard Reeds, M.D.    Medications:    . antiseptic oral rinse  15 mL Mouth Rinse q12n4p  . chlorhexidine  15 mL Mouth Rinse BID  . feeding supplement  1 Container Oral TID BM  . heparin  5,000 Units Subcutaneous Q8H  . lip balm  1 application Topical BID  . mulitivitamin with minerals  1 tablet Oral Daily  . pantoprazole  40 mg Oral BID AC  . piperacillin-tazobactam (ZOSYN)  IV  3.375 g Intravenous Q8H  . thiamine  100 mg Oral Daily  . DISCONTD: pantoprazole (PROTONIX) IV  40 mg Intravenous Q12H  . DISCONTD: thiamine  100 mg Intravenous Daily    Assessment/Plan Anterior gastric perforation-prepyloric, s/p Emergency exploratory laparotomy, biopsy of anterior gastric perforation followed by primary closure and omental patching 11/26/11 Dr. Abbey Chatters. POD9 Hx ETOH/ +cocaine and MJ on admission (2-40's per day at home)  Denies any other medical issues; ON SIWA protocol.  ? Malnutrition    Plan:  I'm gonna recheck her labs, and C diff, she's still on Zosyn, Will stop in AM.    LOS: 10 days    Gabrielle Davis 12/05/2011

## 2011-12-06 DIAGNOSIS — E876 Hypokalemia: Secondary | ICD-10-CM

## 2011-12-06 LAB — CBC
MCHC: 33.9 g/dL (ref 30.0–36.0)
Platelets: 549 10*3/uL — ABNORMAL HIGH (ref 150–400)
RDW: 14.6 % (ref 11.5–15.5)

## 2011-12-06 LAB — COMPREHENSIVE METABOLIC PANEL
ALT: 11 U/L (ref 0–35)
AST: 16 U/L (ref 0–37)
Albumin: 1.8 g/dL — ABNORMAL LOW (ref 3.5–5.2)
Alkaline Phosphatase: 69 U/L (ref 39–117)
BUN: 4 mg/dL — ABNORMAL LOW (ref 6–23)
Potassium: 2.9 mEq/L — ABNORMAL LOW (ref 3.5–5.1)
Sodium: 137 mEq/L (ref 135–145)
Total Protein: 5 g/dL — ABNORMAL LOW (ref 6.0–8.3)

## 2011-12-06 MED ORDER — MAGNESIUM OXIDE 400 MG PO TABS
400.0000 mg | ORAL_TABLET | Freq: Three times a day (TID) | ORAL | Status: DC
  Administered 2011-12-06 – 2011-12-07 (×5): 400 mg via ORAL
  Filled 2011-12-06 (×6): qty 1

## 2011-12-06 MED ORDER — POTASSIUM CHLORIDE CRYS ER 20 MEQ PO TBCR
30.0000 meq | EXTENDED_RELEASE_TABLET | Freq: Three times a day (TID) | ORAL | Status: DC
  Administered 2011-12-06 – 2011-12-07 (×7): 30 meq via ORAL
  Filled 2011-12-06 (×9): qty 1

## 2011-12-06 MED ORDER — MAGNESIUM SULFATE 40 MG/ML IJ SOLN
2.0000 g | Freq: Once | INTRAMUSCULAR | Status: DC
  Filled 2011-12-06: qty 50

## 2011-12-06 NOTE — Progress Notes (Signed)
RN notified

## 2011-12-06 NOTE — Progress Notes (Signed)
10 Days Post-Op  Subjective: IV is out and hard to restart.  No further diarrhea, no BM yesterday or today so far.  K+ very low and being replace.    Objective: Vital signs in last 24 hours: Temp:  [98.4 F (36.9 C)-99 F (37.2 C)] 98.4 F (36.9 C) (04/23 0600) Pulse Rate:  [50-63] 50  (04/23 0600) Resp:  [16-18] 16  (04/23 0600) BP: (131-146)/(76-85) 131/85 mmHg (04/23 0600) SpO2:  [88 %-94 %] 88 % (04/23 0600) Last BM Date: 12/04/11 1 BM yesterday. 960 PO, afebrile, VSS, BP is borderline high, K+ 2.9,C diff isn't back Intake/Output from previous day: 04/22 0701 - 04/23 0700 In: 150 [IV Piggyback:150] Out: -  Intake/Output this shift:    General appearance: alert, cooperative and no distress Resp: clear to auscultation bilaterally GI: still a little distended, tolerating PO's + flatus, incisions a little red.  Staples still in  Lab Results:   [facility] 12/06/11 0423  WBC 7.0  HGB 9.4*  HCT 27.7*  PLT 549*    BMET  Basename 12/06/11 0423  NA 137  K 2.9*  CL 106  CO2 23  GLUCOSE 83  BUN 4*  CREATININE 0.72  CALCIUM 7.9*   PT/INR No results found for this basename: LABPROT:2,INR:2 in the last 72 hours   Lab 12/06/11 0423  AST 16  ALT 11  ALKPHOS 69  BILITOT 0.2*  PROT 5.0*  ALBUMIN 1.8*     Lipase     Component Value Date/Time   LIPASE 34 11/26/2011 0150     Studies/Results: Dg Abd Acute W/chest  12/05/2011  *RADIOLOGY REPORT*  Clinical Data: Abdominal distention after surgery for perforated ulcer  ACUTE ABDOMEN SERIES (ABDOMEN 2 VIEW & CHEST 1 VIEW)  Comparison: 11/26/2011 and 12/03/2011.  Findings: Heart size appears mildly enlarged.  Interval development of bilateral pleural effusions. Induced opacification with air bronchograms within both medial lung bases is noted.  Similar appearance of chronic appearing right posterior rib fractures.  Skin staples are noted along the midline of the abdomen.  There is gaseous distention of large and small bowel  loops with air-fluid levels on the upright images.  Likely ileus.  No free intraperitoneal air is noted.  IMPRESSION:  1.  Bowel gas pattern compatible with postoperative ileus. 2.  Bilateral pleural effusions and bilateral lower lobe airspace consolidation  Original Report Authenticated By: Rosealee Albee, M.D.    Medications:    . antiseptic oral rinse  15 mL Mouth Rinse q12n4p  . chlorhexidine  15 mL Mouth Rinse BID  . feeding supplement  1 Container Oral TID BM  . heparin  5,000 Units Subcutaneous Q8H  . lip balm  1 application Topical BID  . magnesium sulfate 1 - 4 g bolus IVPB  2 g Intravenous Once  . mulitivitamin with minerals  1 tablet Oral Daily  . pantoprazole  40 mg Oral BID AC  . potassium chloride  30 mEq Oral TID AC & HS  . thiamine  100 mg Oral Daily  . DISCONTD: piperacillin-tazobactam (ZOSYN)  IV  3.375 g Intravenous Q8H    Assessment/Plan Anterior gastric perforation-prepyloric, s/p Emergency exploratory laparotomy, biopsy of anterior gastric perforation followed by primary closure and omental patching 11/26/11 Dr. Abbey Chatters. 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  ? Malnutrition    Plan:  Replace K+, take out some staples, and stopping antibiotics,  Hopefully home  tomorrow.    LOS: 11 days    Gabrielle Davis 12/06/2011

## 2011-12-06 NOTE — Progress Notes (Signed)
Patient is alert and oriented, vital signs are stable, patient with no stools today, incision to midline abd is within normal limits, patient is tolerating diet without complaints of nausea or vomiting, pt is up and ambulating without assistance, pt with no IV access MD is aware, medicated for pain with 2 tabs of percocet twice this shift, will continue to monitor Means, Gabrielle Davis N RN 16:07pm

## 2011-12-06 NOTE — Progress Notes (Signed)
No further diarrhea - will cancel Contact Precautions  Incision - minimal drainage and erythema; no need to open further at this time  Will replete K and plan on discharge tomorrow morning.  [name]. [name], MD, [facility]  12/06/2011 2:11 PM

## 2011-12-07 DIAGNOSIS — F1990 Other psychoactive substance use, unspecified, uncomplicated: Secondary | ICD-10-CM | POA: Diagnosis present

## 2011-12-07 DIAGNOSIS — K9189 Other postprocedural complications and disorders of digestive system: Secondary | ICD-10-CM | POA: Diagnosis not present

## 2011-12-07 LAB — CBC
MCH: 32.1 pg (ref 26.0–34.0)
MCV: 95.3 fL (ref 78.0–100.0)
Platelets: 657 10*3/uL — ABNORMAL HIGH (ref 150–400)
RBC: 2.99 MIL/uL — ABNORMAL LOW (ref 3.87–5.11)

## 2011-12-07 LAB — BASIC METABOLIC PANEL
CO2: 23 mEq/L (ref 19–32)
Calcium: 8.2 mg/dL — ABNORMAL LOW (ref 8.4–10.5)
Glucose, Bld: 85 mg/dL (ref 70–99)
Sodium: 138 mEq/L (ref 135–145)

## 2011-12-07 MED ORDER — OXYCODONE-ACETAMINOPHEN 5-325 MG PO TABS: 1.0000 | ORAL_TABLET | ORAL | Status: DC | PRN

## 2011-12-07 MED ORDER — PANTOPRAZOLE SODIUM 40 MG PO TBEC: 40.0000 mg | DELAYED_RELEASE_TABLET | Freq: Two times a day (BID) | ORAL | Status: DC

## 2011-12-07 MED ORDER — ADULT MULTIVITAMIN W/MINERALS CH: 1.0000 | ORAL_TABLET | Freq: Every day | ORAL | Status: DC

## 2011-12-07 MED ORDER — ACETAMINOPHEN 325 MG PO TABS: 650.0000 mg | ORAL_TABLET | Freq: Four times a day (QID) | ORAL | Status: DC | PRN

## 2011-12-07 NOTE — Discharge Summary (Signed)
Physician Discharge Summary  Patient ID: BERNETTE SEEMAN MRN: [id number] DOB/AGE: [birth year] 51 y.o.  Admit date: 11/25/2011 Discharge date: 12/07/2011  Admission Diagnoses: Perforated viscus most likely gastric or duodenal ulcer   Discharge Diagnoses: Anterior gastric perforation-prepyloric, s/p Emergency exploratory laparotomy, biopsy of anterior gastric perforation followed by primary closure and omental patching 11/26/11 Dr. Abbey Chatters. POD2  Hx ETOH/ +cocaine and MJ on admission (2-40's per day at home)   Active Problems:  * No active hospital problems. *    PROCEDURES: Anterior gastric perforation-prepyloric 11/26/2011  Dr. Huntley Dec Course: This is a 51 year old female who has been having upper abdominal pain for about a week. She took antacids and it helped some. Yesterday afternoon the pain got acutely worse and radiated down to her lower abdomen. She had some nausea and vomiting. She's had some chills and sweats. No diarrhea. She denies peptic ulcer disease. She presented to the emergency department for evaluation and had findings consistent with a gastrointestinal perforation most likely gastric ulcer. I was called to see her at that time.  She was taken to the OR with perforation found in prepyloric area, and underwent repair. She has done fairly well although she had a post op ileus.  She was placed on the SIWA protocol for her Drug screen.  She has had no issues with this.   Her staples were removed today, she is tolerating her full diet, and bowel movements are back to almost normal. She still has some mild distension, but is overall doing much better.  Disposition: 01-Home or Self Care   Medication List  As of 12/07/2011  5:56 PM   TAKE these medications         acetaminophen 325 MG tablet   Commonly known as: TYLENOL   Take 2 tablets (650 mg total) by mouth every 6 (six) hours as needed for pain.      mulitivitamin with minerals Tabs   Take 1  tablet by mouth daily.      oxyCODONE-acetaminophen 5-325 MG per tablet   Commonly known as: PERCOCET   Take 1-2 tablets by mouth every 4 (four) hours as needed.      pantoprazole 40 MG tablet   Commonly known as: PROTONIX   Take 1 tablet (40 mg total) by mouth 2 (two) times daily before a meal.           Follow-up Information    Follow up with ROSENBOWER,TODD J, MD. Schedule an appointment as soon as possible for a visit in 2 weeks.   Contact information:   [facility] [address] [place] [address] [phone]       Schedule an appointment as soon as possible for a visit with Primary care:. (You need to obtain a primary care physician to help follow you after your surgery.)          Signed: Tanisha Lutes 12/07/2011, 5:56 PM

## 2011-12-07 NOTE — Discharge Instructions (Signed)
[facility]      [facility] [phone]  OPEN ABDOMINAL SURGERY: POST OP INSTRUCTIONS  Always review your discharge instruction sheet given to you by the facility where your surgery was performed.  IF YOU HAVE DISABILITY OR FAMILY LEAVE FORMS, YOU MUST BRING THEM TO THE OFFICE FOR PROCESSING.  PLEASE DO NOT GIVE THEM TO YOUR DOCTOR.  1. A prescription for pain medication may be given to you upon discharge.  Take your pain medication as prescribed, if needed.  If narcotic pain medicine is not needed, then you may take acetaminophen (Tylenol) or ibuprofen (Advil) as needed. 2. Take your usually prescribed medications unless otherwise directed. 3. If you need a refill on your pain medication, please contact your pharmacy. They will contact our office to request authorization.  Prescriptions will not be filled after 5pm or on week-ends. 4. You should follow a light diet the first few days after arrival home, such as soup and crackers, pudding, etc.unless your doctor has advised otherwise. A high-fiber, low fat diet can be resumed as tolerated.   Be sure to include lots of fluids daily. Most patients will experience some swelling and bruising on the chest and neck area.  Ice packs will help.  Swelling and bruising can take several days to resolve 5. Most patients will experience some swelling and bruising in the area of the incision. Ice pack will help. Swelling and bruising can take several days to resolve..  6. It is common to experience some constipation if taking pain medication after surgery.  Increasing fluid intake and taking a stool softener will usually help or prevent this problem from occurring.  A mild laxative (Milk of Magnesia or Miralax) should be taken according to package directions if there are no bowel movements after 48 hours. 7.  You may have steri-strips (small skin tapes) in place directly over the incision.  These strips should be left on the skin for 7-10 days.  If your  surgeon used skin glue on the incision, you may shower in 24 hours.  The glue will flake off over the next 2-3 weeks.  Any sutures or staples will be removed at the office during your follow-up visit. You may find that a light gauze bandage over your incision may keep your staples from being rubbed or pulled. You may shower and replace the bandage daily. 8. ACTIVITIES:  You may resume regular (light) daily activities beginning the next day--such as daily self-care, walking, climbing stairs--gradually increasing activities as tolerated.  You may have sexual intercourse when it is comfortable.  Refrain from any heavy lifting or straining until approved by your doctor. a. You may drive when you no longer are taking prescription pain medication, you can comfortably wear a seatbelt, and you can safely maneuver your car and apply brakes b. Return to Work: ___________________________________ 9. You should see your doctor in the office for a follow-up appointment approximately two weeks after your surgery.  Make sure that you call for this appointment within a day or two after you arrive home to insure a convenient appointment time. OTHER INSTRUCTIONS:  _____________________________________________________________ _____________________________________________________________  WHEN TO CALL YOUR DOCTOR: 1. Fever over 101.0 2. Inability to urinate 3. Nausea and/or vomiting 4. Extreme swelling or bruising 5. Continued bleeding from incision. 6. Increased pain, redness, or drainage from the incision. 7. Difficulty swallowing or breathing 8. Muscle cramping or spasms. 9. Numbness or tingling in hands or feet or around lips.  The clinic staff is available to   answer your questions during regular business hours.  Please don't hesitate to call and ask to speak to one of the nurses if you have concerns.  For further questions, please visit [email or web address]  Peritonitis Peritonitis is inflammation  (the body's way of reacting to injury and/or infection) of the peritoneum. The peritoneum is the tissue that lines the abdomen and covers the internal organs. CAUSES   Conditions or injuries cause organs to leak stool, bacteria, fungi or chemicals (such as bile or other digestive juices) into the abdomen. When these substances come into contact with the peritoneum, they may cause irritation or infection.   Many conditions can cause peritonitis, including:   Pancreatitis.   Diverticulitis.   Appendicitis.   Ulcers.   Crohn's disease or ulcerative colitis.   Other infections inside the abdomen or pelvis.   Abdominal injury.   Injury to the stomach or esophagus (food pipe).   Cancer.   Liver disease.   Peritoneal dialysis (a procedure used to cleanse the blood when the kidneys are unable to do so).   Tuberculosis.  SYMPTOMS  Symptoms of peritonitis may include:  Severe pain.   Abdominal swelling.   Fever and chills.   Nausea and vomiting.   Poor or no appetite.   Inability to pass gas or stool.  DIAGNOSIS  Diagnosis begins with a complete physical exam. The abdomen may be tender or hard and board-like. Testing may include:  Blood tests.   Urinalysis   Stool analysis (if there is associated diarrhea).   Paracentesis: a procedure where a very thin needle may be used to take a sample of fluid from within the abdomen. This fluid can be examined for signs of infection.   X-ray.   Ultrasound.   CT scans.  TREATMENT  This must be treated quickly, to avoid a severe, life-threatening infection that involves the whole body (sepsis). This must be done in the hospital. Treatment for peritonitis may include:  Antibiotics.   Surgery to remove infected fluid and tissue.   Surgery to treat conditions that cause peritonitis (such as an appendectomy for appendicitis).  HOME CARE INSTRUCTIONS  Even after successful treatment in the hospital, treatment and recovery will  continue at home. It is important to:  Take medicines (such as antibiotics) exactly as prescribed by your caregiver.   If you were taking prescription medicines for other problems before you developed peritonitis, ask about when you should re-start these medicines.   Only take over-the-counter or prescription medicines for pain, discomfort or fever as directed by your caregiver.   Follow your prescribed diet. You might need a higher fiber diet to help avoid constipation.   Drink extra water.   Use a stool softener or laxative, if recommended.   Get extra rest.  SEEK IMMEDIATE MEDICAL CARE IF:  You have increased abdominal pain or tenderness, or abdominal swelling.   You develop an unexplained oral temperature above 102 F (38.9 C).   You start to have the chills.   You have new problems with passing urine.   You develop chest pains and/or shortness of breath.   You experience nausea, vomiting or diarrhea.   You are unable to pass gas or stool.   You have had surgery and notice the healing incision has become hot, red, swollen or is leaking pus or blood.  Document Released: 07/14/2008 Document Revised: 07/21/2011 Document Reviewed: 07/14/2008 [facility] Patient Information 2012 [place], [place].Ulcers of the Gastrointestinal Tract You have an ulcer or are likely to get  ulcers more often than most people. An ulcer is a break or hole in the lining of the esophagus (food tube from the mouth to the stomach), stomach, or the first part of the small bowel. CAUSES   Germs (bacteria). There is a bacterium related to ulcers called Helicobacter Pylori.   Medications such as the nonsteroidal anti-inflammatory medications.   Cigarette smoking is related to ulcers and it does not help them heal.  SYMPTOMS   Burning or gnawing of the mid upper belly (abdomen). This is usually relieved with food or antacids.   Feeling sick to your stomach (nausea).   Bloating.   Vomiting.   If the  ulcer results in bleeding, it can cause:   Hirschfeld tarry stools.   Vomiting of bright red blood.   With severe bleeding, there may be:   Loss of consciousness and shock.   Vomiting coffee ground looking materials.  DIAGNOSIS  Learning what is wrong (diagnosis) is usually made with x-rays (barium studies) and upper GI (gastrointestinal) endoscopy. With endoscopy, a flexible tube is used to look at the esophagus, stomach, and small bowel. Abnormal areas may be biopsied. This is when a small piece of tissue is removed to look at under a microscope. In young people, it is safe to treat without studies (barium x-rays, for example) and to use diagnostic studies on those who do not respond. TREATMENT   Avoid tobacco, alcohol, and foods that seem to make your pain worse. Tobacco use will slow the healing process.   Take other medications as directed. Your caregiver may prescribe medications known as H2 blockers that cut down on the production of acid. Other medications are available that protect the lining of the bowel.   Continue regular work and usual activities unless told otherwise by your caregiver.   If you failed to respond to the usual ulcer treatments, ask your caregiver if antibiotics are a consideration. These are medications that kill germs.  SEEK IMMEDIATE MEDICAL CARE IF:  You develop:  Bright red bleeding.   Vomit blood.   Become light-headed, weak.   Have fainting episodes.   Become sweaty, cold and clammy.   You have severe abdominal pain not controlled by medications. Do not take pain medications unless told by your caregiver.  Document Released: 12/22/2000 Document Revised: 07/21/2011 Document Reviewed: 05/11/2005 [facility] Patient Information 2012 [place], [place].

## 2011-12-07 NOTE — Progress Notes (Signed)
11 Days Post-Op  Subjective: Still says abdomen doesn't feel right.  Taking PO's well.BM x 2 almost normal  Objective: Vital signs in last 24 hours: Temp:  [98.5 F (36.9 C)-98.8 F (37.1 C)] 98.5 F (36.9 C) (04/24 0619) Pulse Rate:  [53-62] 62  (04/24 0619) Resp:  [16-18] 18  (04/24 0619) BP: (140-142)/(76-79) 140/76 mmHg (04/24 0619) SpO2:  [91 %-95 %] 91 % (04/24 0619) Last BM Date: 12/07/11  +BM, K+ back tro normal, other labs Ok too  Intake/Output from previous day: 04/23 0701 - 04/24 0700 In: 240 [P.O.:240] Out: 1 [Stool:1] Intake/Output this shift: Total I/O In: 240 [P.O.:240] Out: -   General appearance: alert, appears stated age and no distress Resp: clear to auscultation bilaterally GI: soft, still a little distended, +BS, +BM, which she says are almost normal.  I took out the rest of her staples, and incision is OK some serous drainage, but it's healing well. I thought I saw her C. Diff was negative, but it's now showing it was canceled.  I'm not really sure what happened there  Lab Results:   Basename 12/07/11 0405 12/06/11 0423  WBC 6.0 7.0  HGB 9.6* 9.4*  HCT 28.5* 27.7*  PLT 657* 549*    BMET  Basename 12/07/11 0405 12/06/11 0423  NA 138 137  K 3.8 2.9*  CL 107 106  CO2 23 23  GLUCOSE 85 83  BUN 3* 4*  CREATININE 0.67 0.72  CALCIUM 8.2* 7.9*   PT/INR No results found for this basename: LABPROT:2,INR:2 in the last 72 hours   Lab 12/06/11 0423  AST 16  ALT 11  ALKPHOS 69  BILITOT 0.2*  PROT 5.0*  ALBUMIN 1.8*     Lipase     Component Value Date/Time   LIPASE 34 11/26/2011 0150     Studies/Results: Dg Abd Acute W/chest  12/05/2011  *RADIOLOGY REPORT*  Clinical Data: Abdominal distention after surgery for perforated ulcer  ACUTE ABDOMEN SERIES (ABDOMEN 2 VIEW & CHEST 1 VIEW)  Comparison: 11/26/2011 and 12/03/2011.  Findings: Heart size appears mildly enlarged.  Interval development of bilateral pleural effusions. Induced  opacification with air bronchograms within both medial lung bases is noted.  Similar appearance of chronic appearing right posterior rib fractures.  Skin staples are noted along the midline of the abdomen.  There is gaseous distention of large and small bowel loops with air-fluid levels on the upright images.  Likely ileus.  No free intraperitoneal air is noted.  IMPRESSION:  1.  Bowel gas pattern compatible with postoperative ileus. 2.  Bilateral pleural effusions and bilateral lower lobe airspace consolidation  Original Report Authenticated By: Rosealee Albee, M.D.    Medications:    . antiseptic oral rinse  15 mL Mouth Rinse q12n4p  . chlorhexidine  15 mL Mouth Rinse BID  . feeding supplement  1 Container Oral TID BM  . heparin  5,000 Units Subcutaneous Q8H  . lip balm  1 application Topical BID  . magnesium oxide  400 mg Oral TID  . mulitivitamin with minerals  1 tablet Oral Daily  . pantoprazole  40 mg Oral BID AC  . potassium chloride  30 mEq Oral TID AC & HS  . thiamine  100 mg Oral Daily    Assessment/Plan Anterior gastric perforation-prepyloric, s/p Emergency exploratory laparotomy, biopsy of anterior gastric perforation followed by primary closure and omental patching 11/26/11 Dr. Abbey Chatters. 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  ? Malnutrition  POD11  Plan:  She still feels distended and sore.  Tolerating PO's well, I think we can let her go home.  She will need someone to help with primary care.  We talked briefly about + drug screen. She is aware that we strongly suggest she discontinue this.  I am going to keep her on low residual diet for now.  Discuss d/c with Dr. [name]      LOS: 12 days    Sherrie George 12/07/2011

## 2011-12-07 NOTE — Progress Notes (Signed)
Feels better this afternoon.  Two BM today.  Abdomen less distended.  Good PO's  Eager to go home.  Will discharge home this evening.  Gabrielle Davis. [name], MD, [facility]  12/07/2011 5:28 PM

## 2011-12-08 ENCOUNTER — Encounter (HOSPITAL_COMMUNITY): Payer: Self-pay | Admitting: General Surgery

## 2011-12-14 ENCOUNTER — Ambulatory Visit (INDEPENDENT_AMBULATORY_CARE_PROVIDER_SITE_OTHER): Payer: Self-pay | Admitting: General Surgery

## 2011-12-14 DIAGNOSIS — K255 Chronic or unspecified gastric ulcer with perforation: Secondary | ICD-10-CM

## 2011-12-14 MED ORDER — HYDROCODONE-ACETAMINOPHEN 5-325 MG PO TABS: 1.0000 | ORAL_TABLET | Freq: Four times a day (QID) | ORAL | Status: AC | PRN

## 2011-12-14 NOTE — Patient Instructions (Signed)
Continue light activities.  Stop smoking.  Do not drink alcohol.  Continue current wound care.

## 2011-12-14 NOTE — Progress Notes (Signed)
Operation: Emergency exploratory laparotomy and repair of perforated prepyloric ulcer  Date: November 25, 2011  Pathology: Benign  HPI: She is here for her first postoperative visit. She is taking Protonix twice a day. The pain is improving. She is eating and her bowels are moving. She is still smoking. She is not using recreational drugs. She has had one beer since discharge.   Physical Exam: Abdomen-soft, incision has some superficial openings that are clean.  Assessment: Progressing satisfactorily postop.  Plan: Continue dry dressing the wound. Continue twice a day Protonix. Stop smoking. Return visit 3 weeks. She will eventually need upper endoscopy to document ulcer healing.

## 2012-01-03 ENCOUNTER — Other Ambulatory Visit (INDEPENDENT_AMBULATORY_CARE_PROVIDER_SITE_OTHER): Payer: Self-pay | Admitting: General Surgery

## 2012-01-03 NOTE — Telephone Encounter (Signed)
Please check with Dr. Abbey Chatters for refill of patient Protonix

## 2012-01-11 ENCOUNTER — Telehealth (INDEPENDENT_AMBULATORY_CARE_PROVIDER_SITE_OTHER): Payer: Self-pay

## 2012-01-11 NOTE — Telephone Encounter (Signed)
Protonix 40mg  tablets, 1 bid before a meal #60 with 1 refill called to [facility]

## 2012-01-13 ENCOUNTER — Telehealth (INDEPENDENT_AMBULATORY_CARE_PROVIDER_SITE_OTHER): Payer: Self-pay

## 2012-01-13 ENCOUNTER — Encounter (INDEPENDENT_AMBULATORY_CARE_PROVIDER_SITE_OTHER): Payer: Self-pay | Admitting: General Surgery

## 2012-01-13 NOTE — Telephone Encounter (Signed)
LMOV pt's appt moved to 12:00 noon on 6/5.

## 2012-01-18 ENCOUNTER — Encounter (INDEPENDENT_AMBULATORY_CARE_PROVIDER_SITE_OTHER): Payer: Self-pay | Admitting: General Surgery

## 2012-01-19 ENCOUNTER — Other Ambulatory Visit (INDEPENDENT_AMBULATORY_CARE_PROVIDER_SITE_OTHER): Payer: Self-pay | Admitting: General Surgery

## 2012-01-19 ENCOUNTER — Ambulatory Visit (INDEPENDENT_AMBULATORY_CARE_PROVIDER_SITE_OTHER): Payer: Self-pay | Admitting: General Surgery

## 2012-01-19 VITALS — BP 102/70 | HR 72 | Temp 97.0°F | Resp 18 | Ht 65.0 in | Wt 93.0 lb

## 2012-01-19 DIAGNOSIS — K255 Chronic or unspecified gastric ulcer with perforation: Secondary | ICD-10-CM

## 2012-01-19 DIAGNOSIS — Z9889 Other specified postprocedural states: Secondary | ICD-10-CM

## 2012-01-19 NOTE — Patient Instructions (Signed)
Avoid aspirin, Goody powders, BCs, ibuprofen and any medicine like this.  You may take Tylenol.  Light activities for 2 more weeks then activities as tolerated. Continue the Protonix.

## 2012-01-19 NOTE — Progress Notes (Signed)
Operation: Emergency exploratory laparotomy and repair of perforated prepyloric ulcer  Date: November 25, 2011  Pathology: Benign  HPI: She is here for her second postoperative visit. She is taking Protonix twice a day. She has minimal pain.  Physical Exam: Abdomen-soft, incision is clean, solid and intact.  Assessment: Doing well postop.  Plan:   Continue Protonix.  Refer to GI for upper endoscopy to document ulcer healing.  Resume normal activities in 2 weeks.  Return visit prn.

## 2012-01-23 ENCOUNTER — Ambulatory Visit: Payer: Self-pay | Admitting: Internal Medicine

## 2012-02-28 ENCOUNTER — Telehealth: Payer: Self-pay | Admitting: *Deleted

## 2012-02-28 NOTE — Telephone Encounter (Signed)
Telephoned patient at home #. Advised patient would need to call [facility] and schedule 6 month follow up for August. Patient voiced understanding.

## 2012-02-29 ENCOUNTER — Other Ambulatory Visit: Payer: Self-pay | Admitting: Obstetrics and Gynecology

## 2012-02-29 DIAGNOSIS — N63 Unspecified lump in unspecified breast: Secondary | ICD-10-CM

## 2012-02-29 DIAGNOSIS — N6459 Other signs and symptoms in breast: Secondary | ICD-10-CM

## 2012-04-11 ENCOUNTER — Ambulatory Visit
Admission: RE | Admit: 2012-04-11 | Discharge: 2012-04-11 | Disposition: A | Discharge status: Home / Self Care | Payer: No Typology Code available for payment source | Source: Ambulatory Visit | Attending: Obstetrics and Gynecology | Admitting: Obstetrics and Gynecology

## 2012-04-11 DIAGNOSIS — N6459 Other signs and symptoms in breast: Secondary | ICD-10-CM

## 2012-06-29 ENCOUNTER — Encounter (HOSPITAL_COMMUNITY): Payer: Self-pay | Admitting: Emergency Medicine

## 2012-06-29 ENCOUNTER — Emergency Department (HOSPITAL_COMMUNITY)
Admission: EM | Admit: 2012-06-29 | Discharge: 2012-06-29 | Disposition: A | Discharge status: Home / Self Care | Payer: Self-pay | Attending: Emergency Medicine | Admitting: Emergency Medicine

## 2012-06-29 ENCOUNTER — Emergency Department (HOSPITAL_COMMUNITY): Payer: Self-pay

## 2012-06-29 DIAGNOSIS — Z79899 Other long term (current) drug therapy: Secondary | ICD-10-CM | POA: Insufficient documentation

## 2012-06-29 DIAGNOSIS — F1010 Alcohol abuse, uncomplicated: Secondary | ICD-10-CM | POA: Insufficient documentation

## 2012-06-29 DIAGNOSIS — F17200 Nicotine dependence, unspecified, uncomplicated: Secondary | ICD-10-CM | POA: Insufficient documentation

## 2012-06-29 DIAGNOSIS — S0180XA Unspecified open wound of other part of head, initial encounter: Secondary | ICD-10-CM | POA: Insufficient documentation

## 2012-06-29 DIAGNOSIS — S52209A Unspecified fracture of shaft of unspecified ulna, initial encounter for closed fracture: Secondary | ICD-10-CM | POA: Insufficient documentation

## 2012-06-29 DIAGNOSIS — S0181XA Laceration without foreign body of other part of head, initial encounter: Secondary | ICD-10-CM

## 2012-06-29 MED ORDER — IBUPROFEN 200 MG PO TABS
400.0000 mg | ORAL_TABLET | Freq: Once | ORAL | Status: AC
  Administered 2012-06-29: 400 mg via ORAL
  Filled 2012-06-29: qty 2

## 2012-06-29 MED ORDER — IBUPROFEN 400 MG PO TABS: 400.0000 mg | ORAL_TABLET | Freq: Four times a day (QID) | ORAL | Status: DC | PRN

## 2012-06-29 MED ORDER — OXYCODONE-ACETAMINOPHEN 5-325 MG PO TABS: 1.0000 | ORAL_TABLET | ORAL | Status: DC | PRN

## 2012-06-29 MED ORDER — OXYCODONE-ACETAMINOPHEN 5-325 MG PO TABS
1.0000 | ORAL_TABLET | Freq: Once | ORAL | Status: AC
  Administered 2012-06-29: 1 via ORAL
  Filled 2012-06-29: qty 1

## 2012-06-29 NOTE — ED Notes (Addendum)
Pt states that she tripped over coffee table at home, feel on her left side and hit head on floor tried to catch herself with hand. C/O of left arm pain with movement. Has cut above lip on left side and inside mouth. Denies LOC with fall. Pt states when she got up she felt dizzy. Denies dizziness or headache at the moment. Pt states she had been drinking last night.
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
## 2012-06-29 NOTE — Progress Notes (Signed)
Orthopedic Tech Progress Note Patient Details:  Gabrielle Davis 1961-04-04 [id number]  Ortho Devices Type of Ortho Device: Arm foam sling;Post splint Splint Material: Plaster Ortho Device/Splint Location: LEFT LONG VOLAR SPLINT AND ARM SLING Ortho Device/Splint Interventions: Application   Cammer, Mickie Bail 06/29/2012, 10:05 AM

## 2012-07-20 ENCOUNTER — Other Ambulatory Visit: Payer: Self-pay | Admitting: Obstetrics and Gynecology

## 2012-07-20 DIAGNOSIS — Z1231 Encounter for screening mammogram for malignant neoplasm of breast: Secondary | ICD-10-CM

## 2012-08-11 ENCOUNTER — Emergency Department (HOSPITAL_COMMUNITY)
Admission: EM | Admit: 2012-08-11 | Discharge: 2012-08-11 | Disposition: A | Discharge status: Home / Self Care | Payer: No Typology Code available for payment source | Attending: Emergency Medicine | Admitting: Emergency Medicine

## 2012-08-11 ENCOUNTER — Encounter (HOSPITAL_COMMUNITY): Payer: Self-pay | Admitting: *Deleted

## 2012-08-11 ENCOUNTER — Emergency Department (HOSPITAL_COMMUNITY): Payer: No Typology Code available for payment source

## 2012-08-11 DIAGNOSIS — S8990XA Unspecified injury of unspecified lower leg, initial encounter: Secondary | ICD-10-CM | POA: Insufficient documentation

## 2012-08-11 DIAGNOSIS — Y939 Activity, unspecified: Secondary | ICD-10-CM | POA: Insufficient documentation

## 2012-08-11 DIAGNOSIS — F17200 Nicotine dependence, unspecified, uncomplicated: Secondary | ICD-10-CM | POA: Insufficient documentation

## 2012-08-11 DIAGNOSIS — M25562 Pain in left knee: Secondary | ICD-10-CM

## 2012-08-11 DIAGNOSIS — IMO0002 Reserved for concepts with insufficient information to code with codable children: Secondary | ICD-10-CM | POA: Insufficient documentation

## 2012-08-11 DIAGNOSIS — Y929 Unspecified place or not applicable: Secondary | ICD-10-CM | POA: Insufficient documentation

## 2012-08-11 DIAGNOSIS — F1010 Alcohol abuse, uncomplicated: Secondary | ICD-10-CM | POA: Insufficient documentation

## 2012-08-11 MED ORDER — HYDROCODONE-ACETAMINOPHEN 5-325 MG PO TABS
2.0000 | ORAL_TABLET | Freq: Once | ORAL | Status: AC
  Administered 2012-08-11: 2 via ORAL
  Filled 2012-08-11: qty 2

## 2012-08-11 MED ORDER — IBUPROFEN 400 MG PO TABS: 400.0000 mg | ORAL_TABLET | Freq: Four times a day (QID) | ORAL | Status: DC | PRN

## 2012-08-11 NOTE — ED Notes (Signed)
Pt escorted to discharge window. Pt verbalized understanding discharge instructions. In no acute distress.  
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
## 2012-08-11 NOTE — ED Notes (Signed)
Pt alert and oriented x4. Respirations even and unlabored, bilateral symmetrical rise and fall of chest. Skin warm and dry. In no acute distress. Denies needs.   

## 2012-08-11 NOTE — ED Provider Notes (Signed)
Medical screening examination/treatment/procedure(s) were performed by non-physician practitioner and as supervising physician I was immediately available for consultation/collaboration.   Gavin Pound. Shaleen Talamantez, MD 08/11/12 1540
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
## 2012-08-22 ENCOUNTER — Encounter (HOSPITAL_COMMUNITY): Payer: Self-pay | Admitting: *Deleted

## 2012-08-30 ENCOUNTER — Ambulatory Visit: Payer: No Typology Code available for payment source

## 2012-09-04 ENCOUNTER — Encounter (HOSPITAL_COMMUNITY): Payer: Self-pay

## 2012-09-04 ENCOUNTER — Ambulatory Visit (HOSPITAL_COMMUNITY)
Admission: RE | Admit: 2012-09-04 | Discharge: 2012-09-04 | Disposition: A | Discharge status: Home / Self Care | Payer: Self-pay | Source: Ambulatory Visit | Attending: Obstetrics and Gynecology | Admitting: Obstetrics and Gynecology

## 2012-09-04 ENCOUNTER — Ambulatory Visit (HOSPITAL_COMMUNITY)
Admission: RE | Admit: 2012-09-04 | Discharge: 2012-09-04 | Disposition: A | Discharge status: Home / Self Care | Payer: No Typology Code available for payment source | Source: Ambulatory Visit | Attending: Obstetrics and Gynecology | Admitting: Obstetrics and Gynecology

## 2012-09-04 VITALS — BP 128/80 | Temp 98.4°F | Ht 65.0 in | Wt 101.0 lb

## 2012-09-04 DIAGNOSIS — Z1231 Encounter for screening mammogram for malignant neoplasm of breast: Secondary | ICD-10-CM

## 2012-09-04 DIAGNOSIS — Z1239 Encounter for other screening for malignant neoplasm of breast: Secondary | ICD-10-CM

## 2012-09-04 NOTE — Progress Notes (Signed)
No complaints today.  Pap Smear:    Pap smear not completed today. Last Pap smear was 08/30/2011 at [facility] and normal. Patient stated thinks she had an abnormal Pap smear over 20 years ago. Per patient's last visit at [facility] she stated she had no history of abnormal Pap smears. Last Pap smear result is in EPIC.  Physical exam: Breasts Breasts symmetrical. No skin abnormalities bilateral breasts. No nipple retraction bilateral breasts. No nipple discharge bilateral breasts. No lymphadenopathy. No lumps palpated bilateral breasts. No complaints of pain or tenderness on exam. Patient escorted to mammography for a screening mammogram.        Pelvic/Bimanual No Pap smear completed today since last Pap smear was 08/30/2011. Pap smear not indicated per BCCCP guidelines.

## 2012-09-04 NOTE — Patient Instructions (Signed)
Reviewed with patient how to perform BSE and gave educational materials to take home. Patient did not need a Pap smear today due to last Pap smear was 08/30/2011. Let her know BCCCP will cover Pap smears every 3 years unless has a history of abnormal Pap smears. Let patient know will follow up with her within the next couple weeks with results by letter or phone. Patient escorted to mammography for a screening mammogram. Patient verbalized understanding.

## 2013-07-20 ENCOUNTER — Emergency Department (HOSPITAL_COMMUNITY): Payer: Self-pay

## 2013-07-20 ENCOUNTER — Encounter (HOSPITAL_COMMUNITY): Payer: Self-pay | Admitting: Emergency Medicine

## 2013-07-20 ENCOUNTER — Emergency Department (HOSPITAL_COMMUNITY)
Admission: EM | Admit: 2013-07-20 | Discharge: 2013-07-20 | Disposition: A | Discharge status: Home / Self Care | Payer: Self-pay | Attending: Emergency Medicine | Admitting: Emergency Medicine

## 2013-07-20 DIAGNOSIS — S0180XA Unspecified open wound of other part of head, initial encounter: Secondary | ICD-10-CM | POA: Insufficient documentation

## 2013-07-20 DIAGNOSIS — S0181XA Laceration without foreign body of other part of head, initial encounter: Secondary | ICD-10-CM

## 2013-07-20 DIAGNOSIS — W1809XA Striking against other object with subsequent fall, initial encounter: Secondary | ICD-10-CM | POA: Insufficient documentation

## 2013-07-20 DIAGNOSIS — F17200 Nicotine dependence, unspecified, uncomplicated: Secondary | ICD-10-CM | POA: Insufficient documentation

## 2013-07-20 DIAGNOSIS — Y929 Unspecified place or not applicable: Secondary | ICD-10-CM | POA: Insufficient documentation

## 2013-07-20 DIAGNOSIS — F1010 Alcohol abuse, uncomplicated: Secondary | ICD-10-CM

## 2013-07-20 DIAGNOSIS — Y939 Activity, unspecified: Secondary | ICD-10-CM | POA: Insufficient documentation

## 2013-07-20 MED ORDER — ACETAMINOPHEN 500 MG PO TABS: 500.0000 mg | ORAL_TABLET | Freq: Four times a day (QID) | ORAL | Status: DC | PRN
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
## 2013-07-20 NOTE — ED Notes (Signed)
Pt states she drank a lot of a lot alcohol last night. Husband states she drank beer,liquor and white wine. Pt laying in bed. Alert. Pt has gouge to upper left eye brow. Wound was cleaned with NS. Gauze placed over wound.
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
## 2013-07-27 ENCOUNTER — Emergency Department (INDEPENDENT_AMBULATORY_CARE_PROVIDER_SITE_OTHER)
Admission: EM | Admit: 2013-07-27 | Discharge: 2013-07-27 | Disposition: A | Discharge status: Home / Self Care | Payer: Self-pay | Source: Home / Self Care

## 2013-07-27 ENCOUNTER — Encounter (HOSPITAL_COMMUNITY): Payer: Self-pay | Admitting: Emergency Medicine

## 2013-07-27 DIAGNOSIS — Z4802 Encounter for removal of sutures: Secondary | ICD-10-CM
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
## 2013-07-27 NOTE — ED Notes (Signed)
Suture removal kit at bedside 

## 2013-07-27 NOTE — ED Provider Notes (Signed)
Medical screening examination/treatment/procedure(s) were performed by resident physician or non-physician practitioner and as supervising physician I was immediately available for consultation/collaboration.   Barkley Bruns MD.   Linna Hoff, MD 07/27/13 [phone]

## 2013-09-20 ENCOUNTER — Encounter (HOSPITAL_COMMUNITY): Payer: Self-pay | Admitting: Emergency Medicine

## 2013-09-20 ENCOUNTER — Emergency Department (HOSPITAL_COMMUNITY)
Admission: EM | Admit: 2013-09-20 | Discharge: 2013-09-20 | Disposition: A | Discharge status: Home / Self Care | Payer: Self-pay | Attending: Emergency Medicine | Admitting: Emergency Medicine

## 2013-09-20 DIAGNOSIS — R040 Epistaxis: Secondary | ICD-10-CM | POA: Insufficient documentation

## 2013-09-20 DIAGNOSIS — F17200 Nicotine dependence, unspecified, uncomplicated: Secondary | ICD-10-CM | POA: Insufficient documentation

## 2013-09-20 MED ORDER — OXYMETAZOLINE HCL 0.05 % NA SOLN
1.0000 | Freq: Once | NASAL | Status: AC
  Administered 2013-09-20: 1 via NASAL
  Filled 2013-09-20: qty 15

## 2013-09-20 NOTE — Discharge Instructions (Signed)

## 2013-09-20 NOTE — ED Provider Notes (Signed)
Medical screening examination/treatment/procedure(s) were performed by non-physician practitioner and as supervising physician I was immediately available for consultation/collaboration.     Labrea Eccleston, MD 09/20/13 1512 

## 2013-09-20 NOTE — ED Notes (Signed)
Pt c/o of nosebleed this am. Hx of same. Currently not bleeding.

## 2013-09-20 NOTE — Progress Notes (Signed)
[facility] provided pt with [employer] information. Patient stated that [facility].

## 2013-09-20 NOTE — ED Provider Notes (Signed)
CSN: [id number]     Arrival date & time 09/20/13  1054 History   First MD Initiated Contact with Patient 09/20/13 1059     No chief complaint on file.  (Consider location/radiation/quality/duration/timing/severity/associated sxs/prior Treatment) Patient is a 53 y.o. female presenting with nosebleeds.  Epistaxis Location:  R nare Severity:  Moderate Duration:  1 day Timing:  Intermittent Progression:  Resolved Chronicity:  New Context: drug use   Context: not anticoagulants, not aspirin use, not BiPAP, not bleeding disorder, not CPAP, not elevation change, not foreign body, not home oxygen, not hypertension, not nose picking, not recent infection, not thrombocytopenia, not trauma and not weather change   Relieved by:  Applying pressure Worsened by:  Nothing tried Associated symptoms: blood in oropharynx   Associated symptoms: no congestion, no cough, no dizziness, no facial pain, no fever, no headaches, no sinus pain, no sneezing, no sore throat and no syncope   Risk factors: no alcohol use, no allergies, no change in medication, no frequent nosebleeds, no head and neck surgery, no head and neck tumor, no intranasal steroids, no radiation treatment, no recent chemotherapy, no recent nasal surgery and no sinus problems  Liver disease: questionable, hx etoh abuse.     Past Medical History  Diagnosis Date  . ETOH abuse    Past Surgical History  Procedure Laterality Date  . Breast lumpectomy      left 20 years ago  . Laparotomy  11/26/2011    Procedure: EXPLORATORY LAPAROTOMY;  Surgeon: Adolph Pollackodd J Rosenbower, MD;  Location: [facility];  Service: General;  Laterality: N/A;   Family History  Problem Relation Age of Onset  . Hypertension Sister   . Cancer Paternal Aunt     breast  . Cancer Paternal Aunt     breast  . Cancer Paternal Aunt     breast  . Cancer Paternal Aunt     breast   History  Substance Use Topics  . Smoking status: Current Every Day Smoker -- 0.50 packs/day for 30  years    Types: Cigarettes  . Smokeless tobacco: Not on file  . Alcohol Use: 1.2 oz/week    2 Cans of beer per week   OB History   Grav Para Term Preterm Abortions TAB SAB Ect Mult Living   0              Review of Systems  Constitutional: Negative for fever.  HENT: Positive for nosebleeds. Negative for congestion, sneezing and sore throat.   Respiratory: Negative for cough.   Cardiovascular: Negative for syncope.  Neurological: Negative for dizziness and headaches.    Allergies  Codeine  Home Medications  No current outpatient prescriptions on file. BP 130/79  Pulse 65  Temp(Src) 97.4 F (36.3 C) (Oral)  Resp 17  SpO2 99% Physical Exam  Constitutional: She is oriented to person, place, and time. She appears well-developed and well-nourished. No distress.  HENT:  Head: Normocephalic and atraumatic.  Nose: No mucosal edema, rhinorrhea, nose lacerations, sinus tenderness, nasal deformity, septal deviation or nasal septal hematoma. No epistaxis (some clotting  present on the septum).  No foreign bodies. Right sinus exhibits no maxillary sinus tenderness and no frontal sinus tenderness. Left sinus exhibits no maxillary sinus tenderness and no frontal sinus tenderness.  Eyes: Conjunctivae are normal. No scleral icterus.  Neck: Normal range of motion.  Cardiovascular: Normal rate, regular rhythm and normal heart sounds.  Exam reveals no gallop and no friction rub.   No murmur heard. Pulmonary/Chest:  Effort normal and breath sounds normal. No respiratory distress.  Abdominal: Soft. Bowel sounds are normal. She exhibits no distension and no mass. There is no tenderness. There is no guarding.  Neurological: She is alert and oriented to person, place, and time.  Skin: Skin is warm and dry. She is not diaphoretic.    ED Course  Procedures (including critical care time) Labs Review Labs Reviewed - No data to display Imaging Review No results found.  EKG Interpretation    None       MDM   1. Epistaxis     Patient with Resolved epistaxis. Afrin applied in ED. Discussed home management and return precautions. Follow  Up with ENT for continuing bleeds.    Arthor Captain, PA-C 09/20/13 1159

## 2013-09-30 ENCOUNTER — Ambulatory Visit: Payer: Self-pay

## 2014-05-28 ENCOUNTER — Emergency Department (HOSPITAL_COMMUNITY)
Admission: EM | Admit: 2014-05-28 | Discharge: 2014-05-28 | Disposition: A | Discharge status: Home / Self Care | Payer: Self-pay | Attending: Emergency Medicine | Admitting: Emergency Medicine

## 2014-05-28 ENCOUNTER — Encounter (HOSPITAL_COMMUNITY): Payer: Self-pay | Admitting: Emergency Medicine

## 2014-05-28 DIAGNOSIS — M79604 Pain in right leg: Secondary | ICD-10-CM

## 2014-05-28 DIAGNOSIS — Z720 Tobacco use: Secondary | ICD-10-CM | POA: Insufficient documentation

## 2014-05-28 DIAGNOSIS — M545 Low back pain, unspecified: Secondary | ICD-10-CM

## 2014-05-28 MED ORDER — MELOXICAM 7.5 MG PO TABS: 7.5000 mg | ORAL_TABLET | Freq: Every day | ORAL | Status: DC

## 2014-05-28 MED ORDER — TRAMADOL HCL 50 MG PO TABS: 50.0000 mg | ORAL_TABLET | Freq: Four times a day (QID) | ORAL | Status: DC | PRN

## 2014-05-28 MED ORDER — METHOCARBAMOL 500 MG PO TABS: 500.0000 mg | ORAL_TABLET | Freq: Two times a day (BID) | ORAL | Status: DC

## 2014-05-28 NOTE — ED Provider Notes (Signed)
CSN: [id number]     Arrival date & time 05/28/14  1051 History   First MD Initiated Contact with Patient 05/28/14 1132     Chief Complaint  Patient presents with  . Back Pain     (Consider location/radiation/quality/duration/timing/severity/associated sxs/prior Treatment) HPI Pt is a 53yo female with c/o right lower back pain that has been intermittent for several months. Pt states last night she was walking home from school with her bag over her right shoulder, states when she put it down she felt a sharp pain in her right lower back. Pain has been constant since then.  Pain is 8/10 at worst, radiating down right leg w/o numbness or tingling in leg or groin. Denies change in bowel or bladder habits. Denies fever, chills, n/v/d. Denies urinary or vaginal symptoms.    Past Medical History  Diagnosis Date  . ETOH abuse    Past Surgical History  Procedure Laterality Date  . Breast lumpectomy      left 20 years ago  . Laparotomy  11/26/2011    Procedure: EXPLORATORY LAPAROTOMY;  Surgeon: Adolph Pollackodd J Rosenbower, MD;  Location: [facility];  Service: General;  Laterality: N/A;   Family History  Problem Relation Age of Onset  . Hypertension Sister   . Cancer Paternal Aunt     breast  . Cancer Paternal Aunt     breast  . Cancer Paternal Aunt     breast  . Cancer Paternal Aunt     breast   History  Substance Use Topics  . Smoking status: Current Every Day Smoker -- 0.50 packs/day for 30 years    Types: Cigarettes  . Smokeless tobacco: Not on file  . Alcohol Use: 1.2 oz/week    2 Cans of beer per week   OB History   Grav Para Term Preterm Abortions TAB SAB Ect Mult Living   0              Review of Systems  Constitutional: Negative for fever and chills.  Gastrointestinal: Negative for nausea, vomiting and abdominal pain.  Genitourinary: Negative for dysuria, urgency, frequency, flank pain, decreased urine volume, menstrual problem and pelvic pain.  Musculoskeletal: Positive for back  pain. Negative for myalgias, neck pain and neck stiffness.  Skin: Negative for color change, rash and wound.  All other systems reviewed and are negative.     Allergies  Codeine  Home Medications   Prior to Admission medications   Medication Sig Start Date End Date Taking? Authorizing Provider  meloxicam (MOBIC) 7.5 MG tablet Take 1 tablet (7.5 mg total) by mouth daily. Take 1-2 pills daily for 7 days, then daily as needed for pain. 05/28/14   Junius FinnerErin O'Malley, PA-C  methocarbamol (ROBAXIN) 500 MG tablet Take 1 tablet (500 mg total) by mouth 2 (two) times daily. 05/28/14   Junius FinnerErin O'Malley, PA-C  traMADol (ULTRAM) 50 MG tablet Take 1 tablet (50 mg total) by mouth every 6 (six) hours as needed. 05/28/14   Junius FinnerErin O'Malley, PA-C   BP 124/77  Pulse 80  Temp(Src) 98 F (36.7 C)  Resp 16  SpO2 100% Physical Exam  Nursing note and vitals reviewed. Constitutional: She appears well-developed and well-nourished. No distress.  HENT:  Head: Normocephalic and atraumatic.  Eyes: Conjunctivae are normal. No scleral icterus.  Neck: Normal range of motion.  Cardiovascular: Normal rate, regular rhythm and normal heart sounds.   Pulmonary/Chest: Effort normal and breath sounds normal. No respiratory distress. She has no wheezes. She has no  rales. She exhibits no tenderness.  Abdominal: Soft. Bowel sounds are normal. She exhibits no distension and no mass. There is no tenderness. There is no rebound and no guarding.  Musculoskeletal: Normal range of motion. She exhibits tenderness.  No midline spinal tenderness. Pain in right lumbar musculature and right buttock. FROM all extremities. Normal gait.  Neurological: She is alert.  Skin: Skin is warm and dry. No rash noted. She is not diaphoretic. No erythema.    ED Course  Procedures (including critical care time) Labs Review Labs Reviewed - No data to display  Imaging Review No results found.   EKG Interpretation None      MDM   Final  diagnoses:  Low back pain radiating to right lower extremity   Patient with back pain. No neurological deficits. Patient is ambulatory. No warning symptoms of back pain including: loss of bowel control, no urinary retention, night sweats, waking from sleep with back pain, unexplained fevers or weight loss, h/o cancer, IVDU, recent trauma. No concern for cauda equina, epidural abscess, or other serious cause of back pain. Conservative measures such as rest, ice/heat and pain medicine indicated with PCP follow-up if no improvement with conservative management.      Junius FinnerErin O'Malley, PA-C 05/28/14 1235

## 2014-05-28 NOTE — ED Notes (Signed)
Pt reports right lower back pain, sts she hurt her back few months ago but it got better on its own until it started hurting again yesterday. Pt denies injury, fall, heavy lifting, she does reports "lot of bending".

## 2014-06-05 NOTE — ED Provider Notes (Signed)
Medical screening examination/treatment/procedure(s) were performed by non-physician practitioner and as supervising physician I was immediately available for consultation/collaboration.   Mishka Stegemann L Adil Tugwell, MD 06/05/14 2117 

## 2014-06-23 ENCOUNTER — Ambulatory Visit: Payer: Self-pay | Attending: Family Medicine

## 2014-07-15 IMAGING — CT CT MAXILLOFACIAL W/O CM
1 series · 15 of 30 positions shown, 19 images · non-contrast
Comparison: None.

CLINICAL DATA: Laceration above left eye

EXAM:
CT MAXILLOFACIAL WITHOUT CONTRAST
TECHNIQUE: Multidetector CT imaging of the maxillofacial structures was
performed. Multiplanar CT image reconstructions were also generated.
A small metallic BB was placed on the right temple in order to
reliably differentiate right from left.

[Series 3: facial st · axial · 0.31mm/px · z∈[-262,-114]mm · 15 of 80 slices shown, 19 images]
[im 3/80  brain]
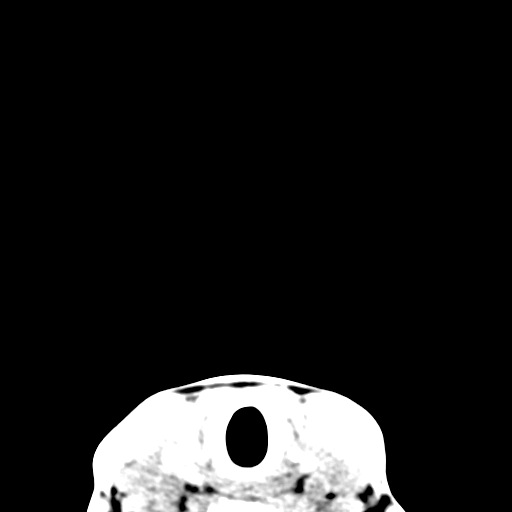
[im 3/80  bone]
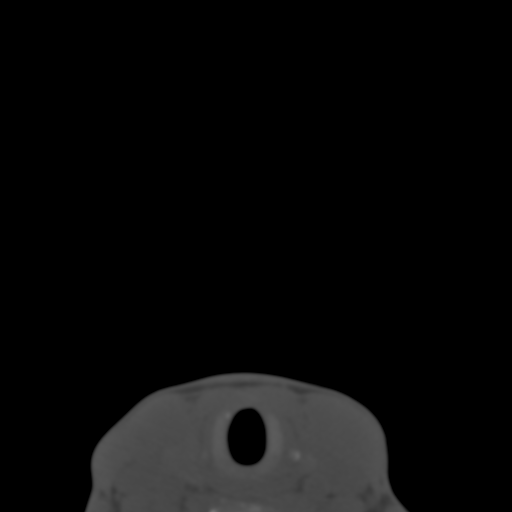
[im 9/80  bone]
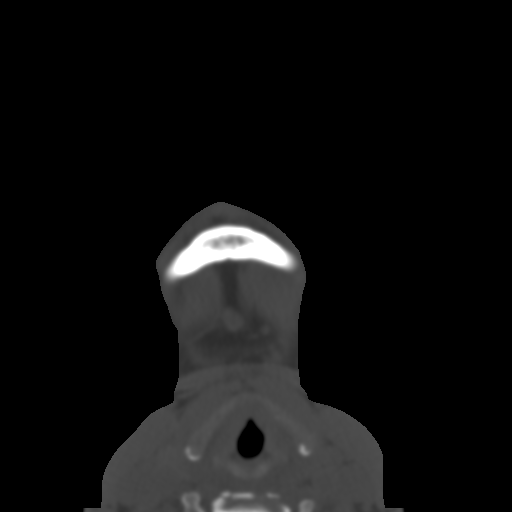
[im 14/80  bone]
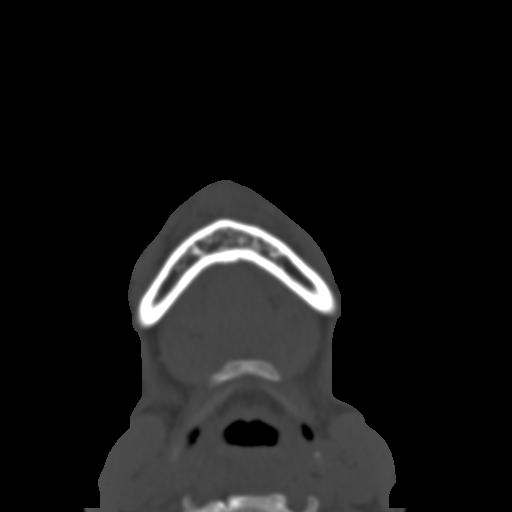
[im 20/80  bone]
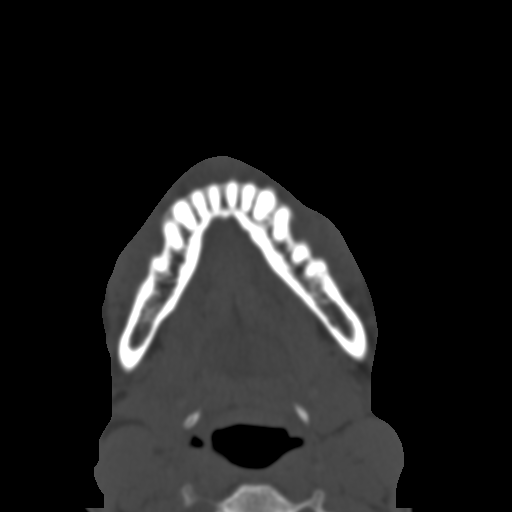
[im 25/80  brain]
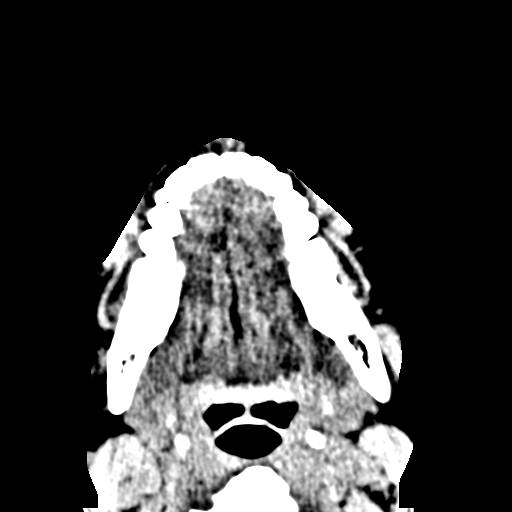
[im 25/80  bone]
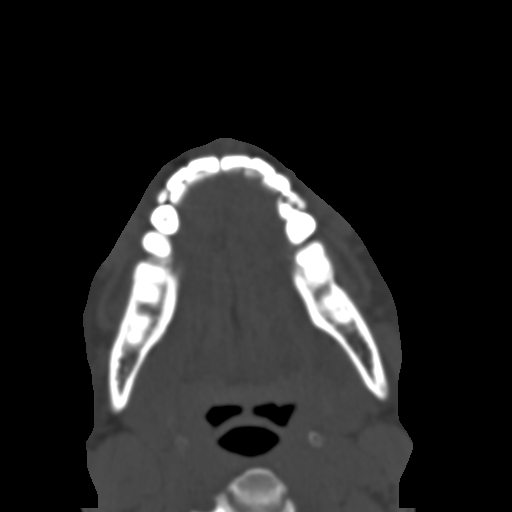
[im 30/80  bone]
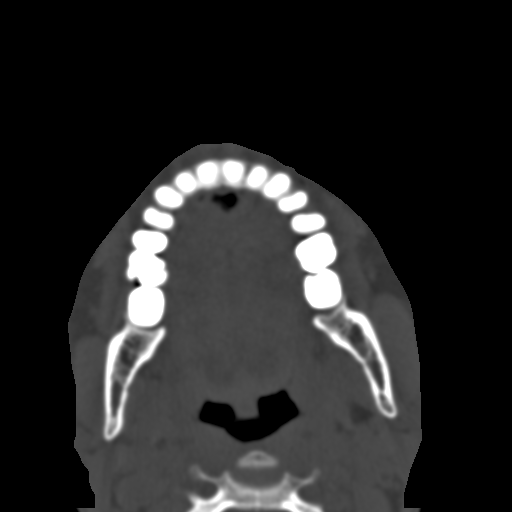
[im 36/80  bone]
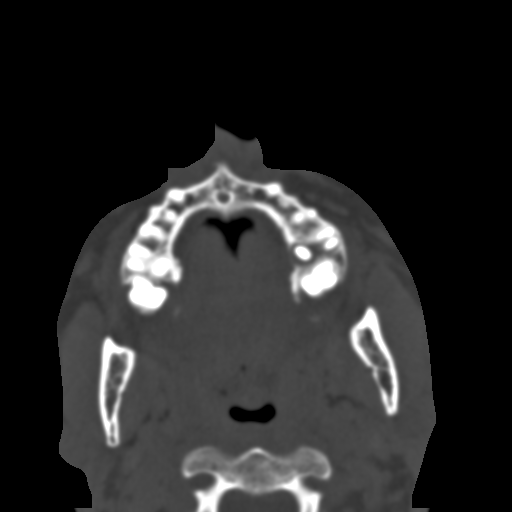
[im 41/80  bone]
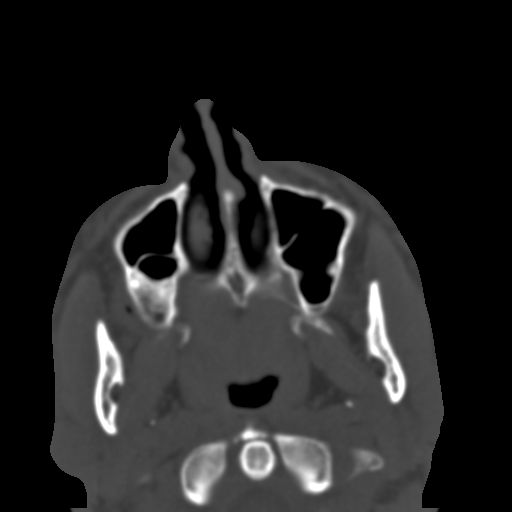
[im 44/80  brain]
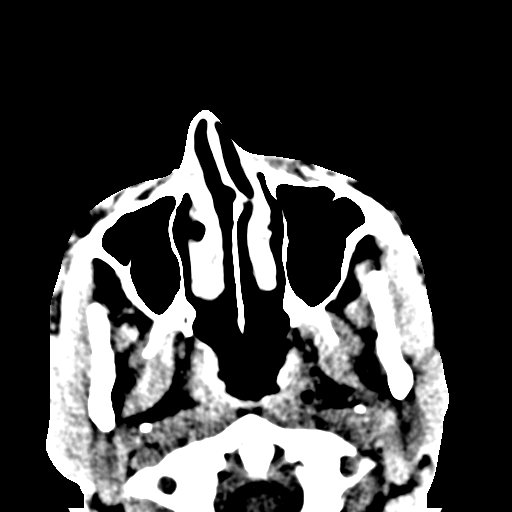
[im 44/80  bone]
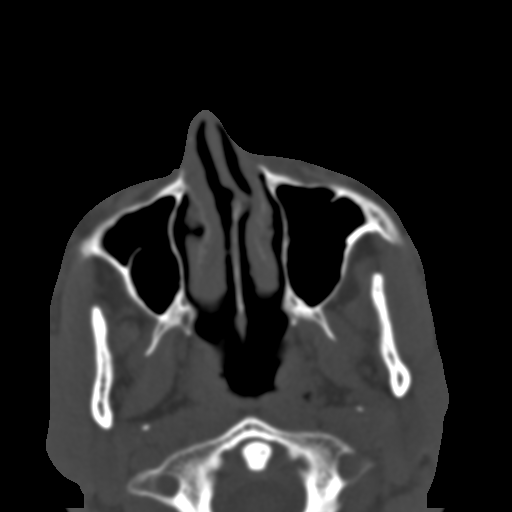
[im 50/80  bone]
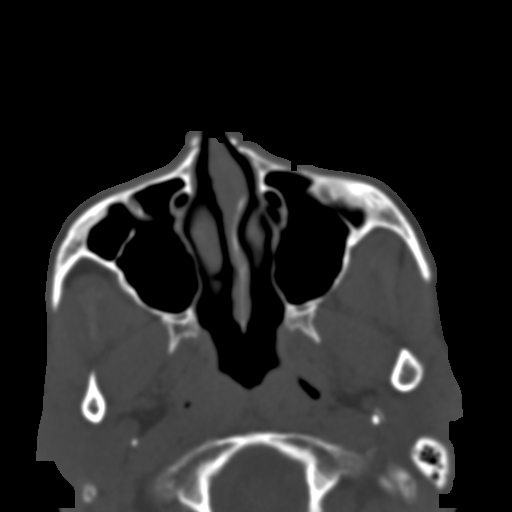
[im 55/80  bone]
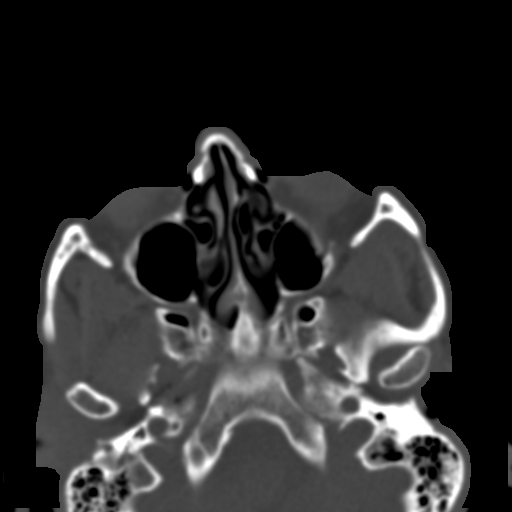
[im 60/80  bone]
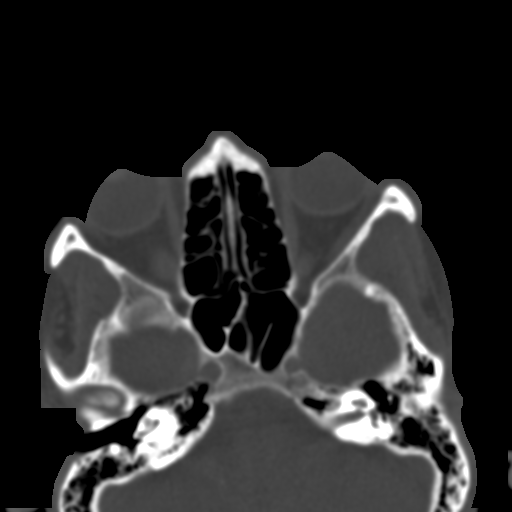
[im 66/80  brain]
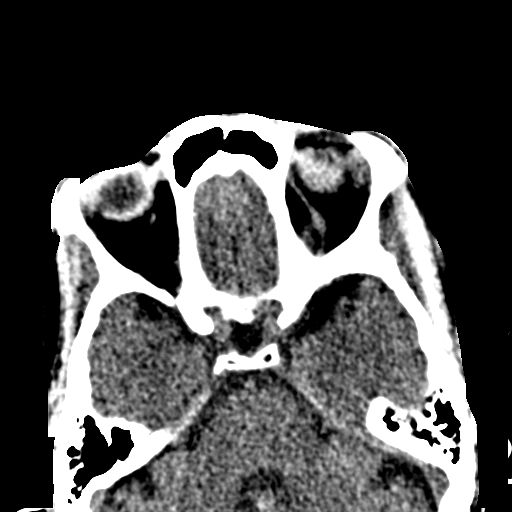
[im 66/80  bone]
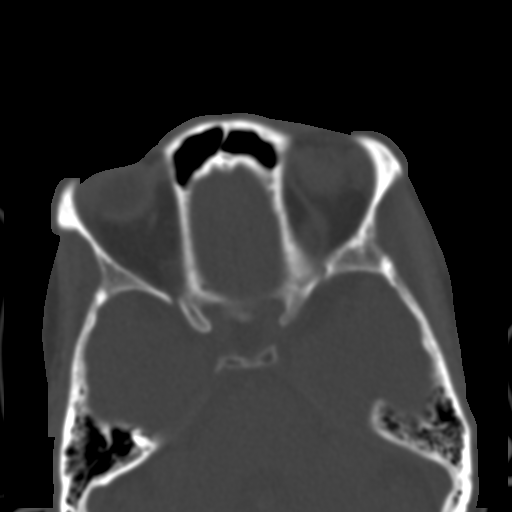
[im 71/80  bone]
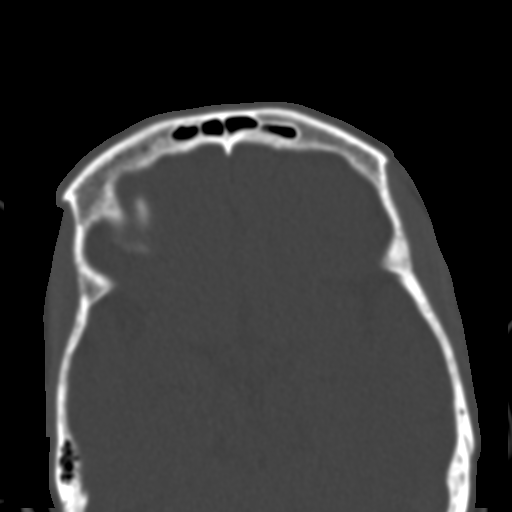
[im 77/80  bone]
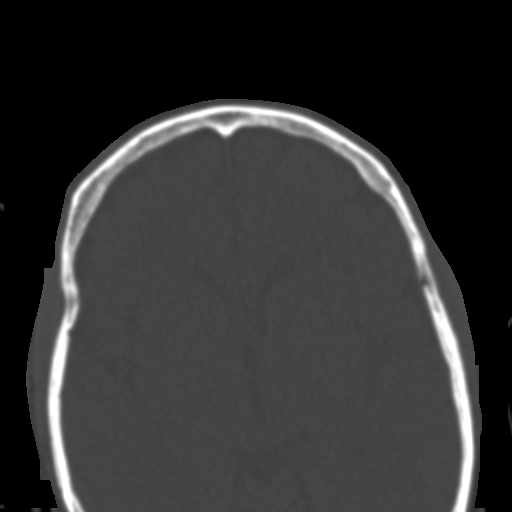

[15 of 30 positions shown; findings below may reference images not displayed]

FINDINGS: Trace soft tissue swelling and irregularity superior and lateral to
the left orbit. The globes are intact and unremarkable. No
retro-orbital abnormality. Negative for acute fracture or a bony
abnormality. The imaged intracranial contents are unremarkable. No
evidence of acute intracranial hemorrhage, mass lesion or mass
effect in the imaged portions. Periapical lucency about the roots of
the left maxillary 1st molar, and right maxillary 2nd molar
consistent with periodontal disease. No lytic or blastic osseous
lesion.
IMPRESSION: 1. Negative for acute fracture or globes/orbits injury.
2. Mild soft tissue swelling superior and lateral to the left orbit
consistent with the clinical history of contusion and laceration.
3. Maxillary periodontal disease as described above.

## 2014-08-07 ENCOUNTER — Emergency Department (INDEPENDENT_AMBULATORY_CARE_PROVIDER_SITE_OTHER)
Admission: EM | Admit: 2014-08-07 | Discharge: 2014-08-07 | Disposition: A | Discharge status: Home / Self Care | Payer: Self-pay | Source: Home / Self Care | Attending: Emergency Medicine | Admitting: Emergency Medicine

## 2014-08-07 ENCOUNTER — Encounter (HOSPITAL_COMMUNITY): Payer: Self-pay | Admitting: Emergency Medicine

## 2014-08-07 DIAGNOSIS — B86 Scabies: Secondary | ICD-10-CM

## 2014-08-07 MED ORDER — TRIAMCINOLONE ACETONIDE 0.1 % EX CREA: 1.0000 "application " | TOPICAL_CREAM | Freq: Two times a day (BID) | CUTANEOUS | Status: DC | PRN

## 2014-08-07 MED ORDER — HYDROXYZINE HCL 25 MG PO TABS: 25.0000 mg | ORAL_TABLET | Freq: Four times a day (QID) | ORAL | Status: DC | PRN

## 2014-08-07 MED ORDER — PERMETHRIN 5 % EX CREA: TOPICAL_CREAM | CUTANEOUS | Status: DC

## 2014-08-07 NOTE — Discharge Instructions (Signed)
You have scabies. Apply the Permethrin cream as on the label. Use triamcinolone cream twice a day as needed for itching. Use the hydroxyzine every 6 hours as needed for itching.  If no improvement by Monday, please come back.   Scabies Scabies are small bugs (mites) that burrow under the skin and cause red bumps and severe itching. These bugs can only be seen with a microscope. Scabies are highly contagious. They can spread easily from person to person by direct contact. They are also spread through sharing clothing or linens that have the scabies mites living in them. It is not unusual for an entire family to become infected through shared towels, clothing, or bedding.  HOME CARE INSTRUCTIONS   Your caregiver may prescribe a cream or lotion to kill the mites. If cream is prescribed, massage the cream into the entire body from the neck to the bottom of both feet. Also massage the cream into the scalp and face if your child is less than 53 year old. Avoid the eyes and mouth. Do not wash your hands after application.  Leave the cream on for 8 to 12 hours. Your child should bathe or shower after the 8 to 12 hour application period. Sometimes it is helpful to apply the cream to your child right before bedtime.  One treatment is usually effective and will eliminate approximately 95% of infestations. For severe cases, your caregiver may decide to repeat the treatment in 1 week. Everyone in your household should be treated with one application of the cream.  New rashes or burrows should not appear within 24 to 48 hours after successful treatment. However, the itching and rash may last for 2 to 4 weeks after successful treatment. Your caregiver may prescribe a medicine to help with the itching or to help the rash go away more quickly.  Scabies can live on clothing or linens for up to 3 days. All of your child's recently used clothing, towels, stuffed toys, and bed linens should be washed in hot water and  then dried in a dryer for at least 20 minutes on high heat. Items that cannot be washed should be enclosed in a plastic bag for at least 3 days.  To help relieve itching, bathe your child in a cool bath or apply cool washcloths to the affected areas.  Your child may return to school after treatment with the prescribed cream. SEEK MEDICAL CARE IF:   The itching persists longer than 4 weeks after treatment.  The rash spreads or becomes infected. Signs of infection include red blisters or yellow-tan crust. Document Released: 08/01/2005 Document Revised: 10/24/2011 Document Reviewed: 12/10/2008 [facility] SquareExit[facility] Patient Information 2015 [place], [place]. This information is not intended to replace advice given to you by your health care provider. Make sure you discuss any questions you have with your health care provider.

## 2014-08-07 NOTE — ED Notes (Signed)
C/o rash on back for a couple of days.  No changes in soaps or detergent.  States only thing doing differently is getting up leaves.

## 2014-08-07 NOTE — ED Provider Notes (Signed)
CSN: [id number]     Arrival date & time 08/07/14  0802 History   First MD Initiated Contact with Patient 08/07/14 [phone]     Chief Complaint  Patient presents with  . Rash   (Consider location/radiation/quality/duration/timing/severity/associated sxs/prior Treatment) HPI She is a 53 year old woman here for evaluation of rash. It started on her back about 3 days ago, and has been spreading around her chest and down her arms. She states it is itchy, particularly at night. No new lotions or soaps or detergents. She has been working out in the yard more recently.  Past Medical History  Diagnosis Date  . ETOH abuse    Past Surgical History  Procedure Laterality Date  . Breast lumpectomy      left 20 years ago  . Laparotomy  11/26/2011    Procedure: EXPLORATORY LAPAROTOMY;  Surgeon: Adolph Pollackodd J Rosenbower, MD;  Location: [facility];  Service: General;  Laterality: N/A;   Family History  Problem Relation Age of Onset  . Hypertension Sister   . Cancer Paternal Aunt     breast  . Cancer Paternal Aunt     breast  . Cancer Paternal Aunt     breast  . Cancer Paternal Aunt     breast   History  Substance Use Topics  . Smoking status: Current Every Day Smoker -- 0.50 packs/day for 30 years    Types: Cigarettes  . Smokeless tobacco: Not on file  . Alcohol Use: 1.2 oz/week    2 Cans of beer per week   OB History    Gravida Para Term Preterm AB TAB SAB Ectopic Multiple Living   0              Review of Systems Rash Allergies  Codeine  Home Medications   Prior to Admission medications   Medication Sig Start Date End Date Taking? Authorizing Provider  hydrOXYzine (ATARAX/VISTARIL) 25 MG tablet Take 1 tablet (25 mg total) by mouth every 6 (six) hours as needed for itching. 08/07/14   Charm RingsErin J Bronc Brosseau, MD  meloxicam (MOBIC) 7.5 MG tablet Take 1 tablet (7.5 mg total) by mouth daily. Take 1-2 pills daily for 7 days, then daily as needed for pain. 05/28/14   Junius FinnerErin O'Malley, PA-C  methocarbamol  (ROBAXIN) 500 MG tablet Take 1 tablet (500 mg total) by mouth 2 (two) times daily. 05/28/14   Junius FinnerErin O'Malley, PA-C  permethrin (ELIMITE) 5 % cream Apply from neck down, leave in place 8-12 hours, then wash off.  Repeat dose in 1 week. 08/07/14   Charm RingsErin J Shaquetta Arcos, MD  traMADol (ULTRAM) 50 MG tablet Take 1 tablet (50 mg total) by mouth every 6 (six) hours as needed. 05/28/14   Junius FinnerErin O'Malley, PA-C  triamcinolone cream (KENALOG) 0.1 % Apply 1 application topically 2 (two) times daily as needed. For itching 08/07/14   Charm RingsErin J Nyiah Pianka, MD   Pulse 73  Temp(Src) 99 F (37.2 C) (Oral)  Resp 16  SpO2 98% Physical Exam  Constitutional: She is oriented to person, place, and time. She appears well-developed and well-nourished. She appears distressed.  Cardiovascular: Normal rate.   Pulmonary/Chest: Effort normal.  Neurological: She is alert and oriented to person, place, and time.  Skin:  Diffuse papules over back, chest and arms with overlying excoriations.    ED Course  Procedures (including critical care time) Labs Review Labs Reviewed - No data to display  Imaging Review No results found.   MDM   1. Scabies  Permethrin cream tonight with repeat dose in 1 week. Triamcinolone cream twice daily as needed for itching. Hydroxyzine every 6 hours as needed for itching. Follow-up if no improvement in rash in the next 3-4 days.    Charm RingsErin J Ger Ringenberg, MD 08/07/14 [phone]

## 2015-01-07 ENCOUNTER — Emergency Department (INDEPENDENT_AMBULATORY_CARE_PROVIDER_SITE_OTHER)
Admission: EM | Admit: 2015-01-07 | Discharge: 2015-01-07 | Disposition: A | Discharge status: Home / Self Care | Payer: No Typology Code available for payment source | Source: Home / Self Care | Attending: Family Medicine | Admitting: Family Medicine

## 2015-01-07 ENCOUNTER — Encounter (HOSPITAL_COMMUNITY): Payer: Self-pay | Admitting: *Deleted

## 2015-01-07 DIAGNOSIS — H6122 Impacted cerumen, left ear: Secondary | ICD-10-CM

## 2015-01-07 MED ORDER — DOCUSATE SODIUM 50 MG/5ML PO LIQD
ORAL | Status: AC
  Filled 2015-01-07: qty 10

## 2015-01-07 NOTE — Discharge Instructions (Signed)
Thank you for coming in today. Use over the counter Debrox drops daily. Return as needed.  Consider following up with ENT doctor.    Cerumen Impaction A cerumen impaction is when the wax in your ear forms a plug. This plug usually causes reduced hearing. Sometimes it also causes an earache or dizziness. Removing a cerumen impaction can be difficult and painful. The wax sticks to the ear canal. The canal is sensitive and bleeds easily. If you try to remove a heavy wax buildup with a cotton tipped swab, you may push it in further. Irrigation with water, suction, and small ear curettes may be used to clear out the wax. If the impaction is fixed to the skin in the ear canal, ear drops may be needed for a few days to loosen the wax. People who build up a lot of wax frequently can use ear wax removal products available in your local drugstore. SEEK MEDICAL CARE IF:  You develop an earache, increased hearing loss, or marked dizziness. Document Released: 09/08/2004 Document Revised: 10/24/2011 Document Reviewed: 10/29/2009 [facility]moreExitCare Patient Information 2015 [place]ExitCare, [place]LL[place]. This information is not intended to replace advice given to you by your health care provider. Make sure you discuss any questions you have with your health care provider.

## 2015-01-07 NOTE — ED Provider Notes (Signed)
Gabrielle Davis is a 54 y.o. female who presents to Urgent Care today for left ear pressure fullness and decreased hearing present for the last 3 months. She suspects that she has earwax blockage. Her symptoms are consistent with previous episodes of blocked ears.   Past Medical History  Diagnosis Date  . ETOH abuse    Past Surgical History  Procedure Laterality Date  . Breast lumpectomy      left 20 years ago  . Laparotomy  11/26/2011    Procedure: EXPLORATORY LAPAROTOMY;  Surgeon: Adolph Pollackodd J Rosenbower, MD;  Location: [facility];  Service: General;  Laterality: N/A;   History  Substance Use Topics  . Smoking status: Current Every Day Smoker -- 0.50 packs/day for 30 years    Types: Cigarettes  . Smokeless tobacco: Not on file  . Alcohol Use: 1.2 oz/week    2 Cans of beer per week   ROS as above Medications: No current facility-administered medications for this encounter.   Current Outpatient Prescriptions  Medication Sig Dispense Refill  . hydrOXYzine (ATARAX/VISTARIL) 25 MG tablet Take 1 tablet (25 mg total) by mouth every 6 (six) hours as needed for itching. 30 tablet 0  . meloxicam (MOBIC) 7.5 MG tablet Take 1 tablet (7.5 mg total) by mouth daily. Take 1-2 pills daily for 7 days, then daily as needed for pain. 30 tablet 0  . methocarbamol (ROBAXIN) 500 MG tablet Take 1 tablet (500 mg total) by mouth 2 (two) times daily. 20 tablet 0  . permethrin (ELIMITE) 5 % cream Apply from neck down, leave in place 8-12 hours, then wash off.  Repeat dose in 1 week. 60 g 1  . traMADol (ULTRAM) 50 MG tablet Take 1 tablet (50 mg total) by mouth every 6 (six) hours as needed. 15 tablet 0  . triamcinolone cream (KENALOG) 0.1 % Apply 1 application topically 2 (two) times daily as needed. For itching 30 g 0   Allergies  Allergen Reactions  . Codeine Itching     Exam:  BP 148/86 mmHg  Pulse 55  Temp(Src) 99.1 F (37.3 C) (Oral)  Resp 12  SpO2 99% Gen: Well NAD HEENT: EOMI,  MMM rheumatoid  impaction left ear. Right is normal. Lungs: Normal work of breathing. CTABL  Heart: RRR no MRG Abd: NABS, Soft. Nondistended, Nontender Exts: Brisk capillary refill, warm and well perfused.   Colace was applied and allowed to sit in the ear canal twice. She had 3 attempts at ear irrigation with no significant improvement. The ear irrigation was discontinued secondary to pain  No results found for this or any previous visit (from the past 24 hour(s)). No results found.  Assessment and Plan: 54 y.o. female with cerumen impaction refractory to irrigation. [facility] Recommend daily Debrox drops.  Discussed warning signs or symptoms. Please see discharge instructions. Patient expresses understanding.     Rodolph BongEvan S Seymore Brodowski, MD 01/07/15 [phone]

## 2015-01-07 NOTE — ED Notes (Signed)
Pt is here with complaints of left ear fullness. Obstruction visualized in ear canal.

## 2015-02-03 ENCOUNTER — Ambulatory Visit: Payer: Self-pay | Attending: Cardiology

## 2015-06-12 ENCOUNTER — Ambulatory Visit: Payer: Self-pay | Attending: Internal Medicine

## 2015-09-29 ENCOUNTER — Ambulatory Visit: Payer: Self-pay | Attending: Internal Medicine

## 2015-12-28 ENCOUNTER — Ambulatory Visit: Payer: Self-pay | Admitting: Family Medicine

## 2016-05-10 ENCOUNTER — Encounter: Payer: Self-pay | Admitting: Internal Medicine

## 2016-05-10 ENCOUNTER — Ambulatory Visit: Payer: Self-pay | Attending: Family Medicine | Admitting: Internal Medicine

## 2016-05-10 ENCOUNTER — Telehealth: Payer: Self-pay

## 2016-05-10 VITALS — BP 123/80 | HR 80 | Temp 98.1°F | Resp 16 | Wt 94.8 lb

## 2016-05-10 DIAGNOSIS — Z1159 Encounter for screening for other viral diseases: Secondary | ICD-10-CM

## 2016-05-10 DIAGNOSIS — F17200 Nicotine dependence, unspecified, uncomplicated: Secondary | ICD-10-CM | POA: Insufficient documentation

## 2016-05-10 DIAGNOSIS — R634 Abnormal weight loss: Secondary | ICD-10-CM

## 2016-05-10 DIAGNOSIS — Z114 Encounter for screening for human immunodeficiency virus [HIV]: Secondary | ICD-10-CM

## 2016-05-10 DIAGNOSIS — Z1321 Encounter for screening for nutritional disorder: Secondary | ICD-10-CM

## 2016-05-10 DIAGNOSIS — Z23 Encounter for immunization: Secondary | ICD-10-CM

## 2016-05-10 DIAGNOSIS — F1010 Alcohol abuse, uncomplicated: Secondary | ICD-10-CM

## 2016-05-10 DIAGNOSIS — Z79899 Other long term (current) drug therapy: Secondary | ICD-10-CM | POA: Insufficient documentation

## 2016-05-10 DIAGNOSIS — E440 Moderate protein-calorie malnutrition: Secondary | ICD-10-CM | POA: Insufficient documentation

## 2016-05-10 DIAGNOSIS — F17210 Nicotine dependence, cigarettes, uncomplicated: Secondary | ICD-10-CM | POA: Insufficient documentation

## 2016-05-10 DIAGNOSIS — J069 Acute upper respiratory infection, unspecified: Secondary | ICD-10-CM | POA: Insufficient documentation

## 2016-05-10 DIAGNOSIS — H6122 Impacted cerumen, left ear: Secondary | ICD-10-CM

## 2016-05-10 LAB — CBC WITH DIFFERENTIAL/PLATELET
BASOS PCT: 1 %
Basophils Absolute: 61 cells/uL (ref 0–200)
EOS ABS: 122 {cells}/uL (ref 15–500)
EOS PCT: 2 %
HCT: 32.9 % — ABNORMAL LOW (ref 35.0–45.0)
Hemoglobin: 11 g/dL — ABNORMAL LOW (ref 11.7–15.5)
LYMPHS PCT: 43 %
Lymphs Abs: 2623 cells/uL (ref 850–3900)
MCH: 32.4 pg (ref 27.0–33.0)
MCHC: 33.4 g/dL (ref 32.0–36.0)
MCV: 97.1 fL (ref 80.0–100.0)
MONOS PCT: 9 %
MPV: 8.4 fL (ref 7.5–12.5)
Monocytes Absolute: 549 cells/uL (ref 200–950)
Neutro Abs: 2745 cells/uL (ref 1500–7800)
Neutrophils Relative %: 45 %
PLATELETS: 686 10*3/uL — AB (ref 140–400)
RBC: 3.39 MIL/uL — ABNORMAL LOW (ref 3.80–5.10)
RDW: 13.6 % (ref 11.0–15.0)
WBC: 6.1 10*3/uL (ref 3.8–10.8)

## 2016-05-10 LAB — CMP AND LIVER
ALT: 7 U/L (ref 6–29)
AST: 18 U/L (ref 10–35)
Albumin: 3.6 g/dL (ref 3.6–5.1)
Alkaline Phosphatase: 75 U/L (ref 33–130)
BILIRUBIN DIRECT: 0 mg/dL (ref <=0.2)
BILIRUBIN INDIRECT: 0.2 mg/dL (ref 0.2–1.2)
BUN: 5 mg/dL — ABNORMAL LOW (ref 7–25)
CHLORIDE: 96 mmol/L — AB (ref 98–110)
CO2: 23 mmol/L (ref 20–31)
Calcium: 8.9 mg/dL (ref 8.6–10.4)
Creat: 0.72 mg/dL (ref 0.50–1.05)
Glucose, Bld: 85 mg/dL (ref 65–99)
Potassium: 4.8 mmol/L (ref 3.5–5.3)
Sodium: 135 mmol/L (ref 135–146)
Total Bilirubin: 0.2 mg/dL (ref 0.2–1.2)
Total Protein: 7.7 g/dL (ref 6.1–8.1)

## 2016-05-10 LAB — TSH: TSH: 0.97 mIU/L

## 2016-05-10 LAB — LIPID PANEL
Cholesterol: 108 mg/dL — ABNORMAL LOW (ref 125–200)
HDL: 54 mg/dL (ref >=46)
LDL Cholesterol: 28 mg/dL (ref <130)
TRIGLYCERIDES: 128 mg/dL (ref <150)
Total CHOL/HDL Ratio: 2 Ratio (ref <=5.0)
VLDL: 26 mg/dL (ref <30)

## 2016-05-10 MED ORDER — CARBAMIDE PEROXIDE 6.5 % OT SOLN: 5.0000 [drp] | Freq: Two times a day (BID) | OTIC | 0 refills | Status: DC

## 2016-05-10 MED FILL — EAR DROPS 6.5%: 6.5 | 20 days supply | Qty: 15 | Fill #0

## 2016-05-10 NOTE — Progress Notes (Signed)
Gabrielle Davis, is a 55 y.o. female  WUJ:[id number]  [id number]:[id number]  DOB - Mar 21, 1961  CC:  Chief Complaint  Patient presents with  . New Patient (Initial Visit)       HPI: Gabrielle Davis is a 55 y.o. female here today to establish medical care., w/ hx of benign left lumpectomy >20 years ago, etoh and tob abuse.  She smokes 1/3 pack tob daily, +smokes marijuana, and drinks 6 pack beer daily.  She use to go to AA mtgs years ago, none currently. Denies getting withdrawals.  Would not answer CAGE questions.  Has not seen doctor for years, worried about findings.  She has not had pap smear >4 years.  She is not up to date on MM.  Throughout my visit, she is working on filling out her financial packet.   She denies any other c/o at this time.  Per pt, about 1 month ago was ill w/ URI, coughing and about 3-4 lb weight loss. Denies current f/c/night sweats. Rare cough, but denies sob/doe.  Patient has No headache, No chest pain, No abdominal pain - No Nausea, No new weakness tingling or numbness, No Cough - SOB.    Review of Systems: Per HPI, o/w all systems reviewed and negative.  Allergies  Allergen Reactions  . Codeine Itching   Past Medical History:  Diagnosis Date  . ETOH abuse    Current Outpatient Prescriptions on File Prior to Visit  Medication Sig Dispense Refill  . hydrOXYzine (ATARAX/VISTARIL) 25 MG tablet Take 1 tablet (25 mg total) by mouth every 6 (six) hours as needed for itching. (Patient not taking: Reported on 05/10/2016) 30 tablet 0  . meloxicam (MOBIC) 7.5 MG tablet Take 1 tablet (7.5 mg total) by mouth daily. Take 1-2 pills daily for 7 days, then daily as needed for pain. (Patient not taking: Reported on 05/10/2016) 30 tablet 0  . methocarbamol (ROBAXIN) 500 MG tablet Take 1 tablet (500 mg total) by mouth 2 (two) times daily. (Patient not taking: Reported on 05/10/2016) 20 tablet 0  . permethrin (ELIMITE) 5 % cream Apply from neck down, leave in place 8-12  hours, then wash off.  Repeat dose in 1 week. (Patient not taking: Reported on 05/10/2016) 60 g 1  . traMADol (ULTRAM) 50 MG tablet Take 1 tablet (50 mg total) by mouth every 6 (six) hours as needed. (Patient not taking: Reported on 05/10/2016) 15 tablet 0  . triamcinolone cream (KENALOG) 0.1 % Apply 1 application topically 2 (two) times daily as needed. For itching (Patient not taking: Reported on 05/10/2016) 30 g 0   No current facility-administered medications on file prior to visit.    Family History  Problem Relation Age of Onset  . Hypertension Sister   . Cancer Paternal Aunt     breast  . Cancer Paternal Aunt     breast  . Cancer Paternal Aunt     breast  . Cancer Paternal Aunt     breast   Social History   Social History  . Marital status: Single    Spouse name: N/A  . Number of children: N/A  . Years of education: N/A   Occupational History  . Not on file.   Social History Main Topics  . Smoking status: Current Every Day Smoker    Packs/day: 0.50    Years: 30.00    Types: Cigarettes  . Smokeless tobacco: Not on file  . Alcohol use 1.2 oz/week    2 Cans of beer  per week  . Drug use: No  . Sexual activity: Yes    Birth control/ protection: Post-menopausal   Other Topics Concern  . Not on file   Social History Narrative  . No narrative on file    Objective:   Vitals:   05/10/16 1010  BP: 123/80  Pulse: 80  Resp: 16  Temp: 98.1 F (36.7 C)    Filed Weights   05/10/16 1010  Weight: 94 lb 12.8 oz (43 kg)    BP Readings from Last 3 Encounters:  05/10/16 123/80  01/07/15 148/86  05/28/14 124/77    Physical Exam: Constitutional: No distress. AAOx3, poor eye contact. Thin appearing, cachetic. pleasant HENT: Normocephalic, atraumatic, External right and left ear normal. Oropharynx is clear and moist.  Right TM clear, cerumen  Eyes: Conjunctivae and EOM are normal. PERRL, no scleral icterus. Neck: Normal ROM. Neck supple. No JVD.  CVS: RRR, S1/S2  +, no murmurs, no gallops, no carotid bruit.  Pulmonary: Effort and breath sounds normal, no stridor, rhonchi, wheezes, rales.   Long exhalation phase. Abdominal: Soft. BS +, no distension, tenderness, rebound or guarding.  Musculoskeletal: Normal range of motion. No edema and no tenderness.  LE: bilat/ no c/c/e, pulses 2+ bilateral. Lymphadenopathy: No lymphadenopathy noted, cervical Neuro: Alert.  muscle tone coordination wnl. No cranial nerve deficit grossly.  Diffuse muscle atrophy throughout. Skin: Skin is warm and dry. No rash noted. Not diaphoretic. No erythema. No pallor. Psychiatric: Normal mood and affect. Behavior, judgment, thought content normal.  Lab Results  Component Value Date   WBC 6.0 12/07/2011   HGB 9.6 (L) 12/07/2011   HCT 28.5 (L) 12/07/2011   MCV 95.3 12/07/2011   PLT 657 (H) 12/07/2011   Lab Results  Component Value Date   CREATININE 0.67 12/07/2011   BUN 3 (L) 12/07/2011   NA 138 12/07/2011   K 3.8 12/07/2011   CL 107 12/07/2011   CO2 23 12/07/2011    No results found for: HGBA1C Lipid Panel  No results found for: CHOL, TRIG, HDL, CHOLHDL, VLDL, LDLCALC     No flowsheet data found.  Assessment and plan:   1. Moderate protein-calorie malnutrition (HCC) Suspect due to alcoholism - set up pt w/ month supply of chocolate ensure - CBC with Differential - CMP and Liver - Lipid Panel - TSH - Prealbumin  2. Smoking addiction Complete cessation recd.  3. Loss of weight May be 2nd to recent illness, but also may be related to alcohol, and copd/emphysema?  4. ETOH abuse Complete cessation recd, recd resuming AA mtgs  5. Encounter for screening for HIV - HIV antibody (with reflex)  6. Need for hepatitis C screening test - Hepatitis C antibody  7. Encounter for vitamin deficiency screening - VITAMIN D 25 Hydroxy (Vit-D Deficiency, Fractures)  8. Flu vaccine need - Flu Vaccine QUAD 36+ mos PF IM (Fluarix & Fluzone Quad PF) - tdap today -  pneumococcal 23v today 9. Ceruminosis, left Debrox gtt, than irrigation next time.  10. Recent uri  - suspect underlying copd component as well, recd pfts once gets orange card/cone discount  11. Health maintenance Needs colonoscopy and MM once gets financial assistance.  12. Applying for financial assistance.   Return in about 3 weeks (around 05/31/2016) for pap.  The patient was given clear instructions to go to ER or return to medical center if symptoms don't improve, worsen or new problems develop. The patient verbalized understanding. The patient was told to call to get  lab results if they haven't heard anything in the next week.    This note has been created with Education officer, environmental. Any transcriptional errors are unintentional.   Pete Glatter, MD, MBA/MHA [facility] [place], [place] [phone]   05/10/2016, 10:27 AM

## 2016-05-10 NOTE — Telephone Encounter (Signed)
Patient returned for lab work

## 2016-05-10 NOTE — Patient Instructions (Addendum)
You Can Quit Smoking If you are ready to quit smoking or are thinking about it, congratulations! You have chosen to help yourself be healthier and live longer! There are lots of different ways to quit smoking. Nicotine gum, nicotine patches, a nicotine inhaler, or nicotine nasal spray can help with physical craving. Hypnosis, support groups, and medicines help break the habit of smoking. TIPS TO GET OFF AND STAY OFF CIGARETTES  Learn to predict your moods. Do not let a bad situation be your excuse to have a cigarette. Some situations in your life might tempt you to have a cigarette.  Ask friends and co-workers not to smoke around you.  Make your home smoke-free.  Never have "just one" cigarette. It leads to wanting another and another. Remind yourself of your decision to quit.  On a card, make a list of your reasons for not smoking. Read it at least the same number of times a day as you have a cigarette. Tell yourself everyday, "I do not want to smoke. I choose not to smoke."  Ask someone at home or work to help you with your plan to quit smoking.  Have something planned after you eat or have a cup of coffee. Take a walk or get other exercise to perk you up. This will help to keep you from overeating.  Try a relaxation exercise to calm you down and decrease your stress. Remember, you may be tense and nervous the first two weeks after you quit. This will pass.  Find new activities to keep your hands busy. Play with a pen, coin, or rubber band. Doodle or draw things on paper.  Brush your teeth right after eating. This will help cut down the craving for the taste of tobacco after meals. You can try mouthwash too.  Try gum, breath mints, or diet candy to keep something in your mouth. IF YOU SMOKE AND WANT TO QUIT:  Do not stock up on cigarettes. Never buy a carton. Wait until one pack is finished before you buy another.  Never carry cigarettes with you at work or at home.  Keep cigarettes  as far away from you as possible. Leave them with someone else.  Never carry matches or a lighter with you.  Ask yourself, "Do I need this cigarette or is this just a reflex?"  Bet with someone that you can quit. Put cigarette money in a piggy bank every morning. If you smoke, you give up the money. If you do not smoke, by the end of the week, you keep the money.  Keep trying. It takes 21 days to change a habit!  Talk to your doctor about using medicines to help you quit. These include nicotine replacement gum, lozenges, or skin patches.   This information is not intended to replace advice given to you by your health care provider. Make sure you discuss any questions you have with your health care provider.   Document Released: 05/28/2009 Document Revised: 10/24/2011 Document Reviewed: 05/28/2009 Elsevier Interactive Patient Education 2016 [employer].  - Alcohol Abuse and Nutrition Alcohol abuse is any pattern of alcohol consumption that harms your health, relationships, or work. Alcohol abuse can affect how your body breaks down and absorbs nutrients from food by causing your liver to work abnormally. Additionally, many people who abuse alcohol do not eat enough carbohydrates, protein, fat, vitamins, and minerals. This can cause poor nutrition (malnutrition) and a lack of nutrients (nutrient deficiencies), which can lead to further complications. Nutrients that  are commonly lacking (deficient) among people who abuse alcohol include:  Vitamins.  Vitamin A. This is stored in your liver. It is important for your vision, metabolism, and ability to fight off infections (immunity).  B vitamins. These include vitamins such as folate, thiamin, and niacin. These are important in new cell growth and maintenance.  Vitamin C. This plays an important role in iron absorption, wound healing, and immunity.  Vitamin D. This is produced by your liver, but you can also get vitamin D from food. Vitamin  D is necessary for your body to absorb and use calcium.  Minerals.  Calcium. This is important for your bones and your heart and blood vessel (cardiovascular) function.  Iron. This is important for blood, muscle, and nervous system functioning.  Magnesium. This plays an important role in muscle and nerve function, and it helps to control blood sugar and blood pressure.  Zinc. This is important for the normal function of your nervous system and digestive system (gastrointestinal tract). Nutrition is an essential component of therapy for alcohol abuse. Your health care provider or dietitian will work with you to design a plan that can help restore nutrients to your body and prevent potential complications. WHAT IS MY PLAN? Your dietitian may develop a specific diet plan that is based on your condition and any other complications you may have. A diet plan will commonly include:  A balanced diet.  Grains: 6-8 oz per day.  Vegetables: 2-3 cups per day.  Fruits: 1-2 cups per day.  Meat and other protein: 5-6 oz per day.  Dairy: 2-3 cups per day.  Vitamin and mineral supplements. WHAT DO I NEED TO KNOW ABOUT ALCOHOL AND NUTRITION?  Consume foods that are high in antioxidants, such as grapes, berries, nuts, green tea, and dark green and orange vegetables. This can help to counteract some of the stress that is placed on your liver by consuming alcohol.  Avoid food and drinks that are high in fat and sugar. Foods such as sugared soft drinks, salty snack foods, and candy contain empty calories. This means that they lack important nutrients such as protein, fiber, and vitamins.  Eat frequent meals and snacks. Try to eat 5-6 small meals each day.  Eat a variety of fresh fruits and vegetables each day. This will help you get plenty of water, fiber, and vitamins in your diet.  Drink plenty of water and other clear fluids. Try to drink at least 48-64 oz (1.5-2 L) of water per day.  If you are  a vegetarian, eat a variety of protein-rich foods. Pair whole grains with plant-based proteins at meals and snacks to obtain the greatest nutrient benefit from your food. For example, eat rice with beans, put peanut butter on whole-grain toast, or eat oatmeal with sunflower seeds.  Soak beans and whole grains overnight before cooking. This can help your body to absorb the nutrients more easily.  Include foods fortified with vitamins and minerals in your diet. Commonly fortified foods include milk, orange juice, cereal, and bread.  If you are malnourished, your dietitian may recommend a high-protein, high-calorie diet. This may include:  2,000-3,000 calories (kilocalories) per day.  70-100 grams of protein per day.  Your health care provider may recommend a complete nutritional supplement beverage. This can help to restore calories, protein, and vitamins to your body. Depending on your condition, you may be advised to consume this instead of or in addition to meals.  Limit your intake of caffeine. Replace drinks  like coffee and Freels tea with decaffeinated coffee and herbal tea.  Eat a variety of foods that are high in omega fatty acids. These include fish, nuts and seeds, and soybeans. These foods may help your liver to recover and may also stabilize your mood.  Certain medicines may cause changes in your appetite, taste, and weight. Work with your health care provider and dietitian to make any adjustments to your medicines and diet plan.  Include other healthy lifestyle choices in your daily routine.  Be physically active.  Get enough sleep.  Spend time doing activities that you enjoy.  If you are unable to take in enough food and calories by mouth, your health care provider may recommend a feeding tube. This is a tube that passes through your nose and throat, directly into your stomach. Nutritional supplement beverages can be given to you through the feeding tube to help you get the  nutrients you need.  Take vitamin or mineral supplements as recommended by your health care provider. WHAT FOODS CAN I EAT? Grains Enriched pasta. Enriched rice. Fortified whole-grain bread. Fortified whole-grain cereal. Barley. Brown rice. Quinoa. Millet. Vegetables All fresh, frozen, and canned vegetables. Spinach. Kale. Artichoke. Carrots. Winter squash and pumpkin. Sweet potatoes. Broccoli. Cabbage. Cucumbers. Tomatoes. Sweet peppers. Green beans. Peas. Corn. Fruits All fresh and frozen fruits. Berries. Grapes. Mango. Papaya. Guava. Cherries. Apples. Bananas. Peaches. Plums. Pineapple. Watermelon. Cantaloupe. Oranges. Avocado. Meats and Other Protein Sources Beef liver. Lean beef. Pork. Fresh and canned chicken. Fresh fish. Oysters. Sardines. Canned tuna. Shrimp. Eggs with yolks. Nuts and seeds. Peanut butter. Beans and lentils. Soybeans. Tofu. Dairy Whole, low-fat, and nonfat milk. Whole, low-fat, and nonfat yogurt. Cottage cheese. Sour cream. Hard and soft cheeses. Beverages Water. Herbal tea. Decaffeinated coffee. Decaffeinated green tea. 100% fruit juice. 100% vegetable juice. Instant breakfast shakes. Condiments Ketchup. Mayonnaise. Mustard. Salad dressing. Barbecue sauce. Sweets and Desserts Sugar-free ice cream. Sugar-free pudding. Sugar-free gelatin. Fats and Oils Butter. Vegetable oil, flaxseed oil, olive oil, and walnut oil. Other Complete nutrition shakes. Protein bars. Sugar-free gum. The items listed above may not be a complete list of recommended foods or beverages. Contact your dietitian for more options. WHAT FOODS ARE NOT RECOMMENDED? Grains Sugar-sweetened breakfast cereals. Flavored instant oatmeal. Fried breads. Vegetables Breaded or deep-fried vegetables. Fruits Dried fruit with added sugar. Candied fruit. Canned fruit in syrup. Meats and Other Protein Sources Breaded or deep-fried meats. Dairy Flavored milks. Fried cheese curds or fried cheese  sticks. Beverages Alcohol. Sugar-sweetened soft drinks. Sugar-sweetened tea. Caffeinated coffee and tea. Condiments Sugar. Honey. Agave nectar. Molasses. Sweets and Desserts Chocolate. Cake. Cookies. Candy. Other Potato chips. Pretzels. Salted nuts. Candied nuts. The items listed above may not be a complete list of foods and beverages to avoid. Contact your dietitian for more information.   This information is not intended to replace advice given to you by your health care provider. Make sure you discuss any questions you have with your health care provider.   Document Released: 05/26/2005 Document Revised: 08/22/2014 Document Reviewed: 03/04/2014 Elsevier Interactive Patient Education 2016 Else[employer] Inc[employer]  - Malnutrition Malnutrition is any condition in which nutrition is poor. There are many forms of malnutrition. A common form is having too little of one kind of nutrient (nutritional deficiency). Nutrients include proteins, minerals, carbohydrates, fats, and vitamins. They provide the body with energy and keep the body working normally. Malnutrition ranges from mild to severe. The condition affects the body's defense system (immune system). Because of this, people who  are malnourished are more likely to develop health problems and get sick. CAUSES Causes of malnutrition include:  Eating an unbalanced diet.  Eating too much of certain foods.  Eating too little.  Conditions that decrease the body's ability to use nutrients. RISK FACTORS Risk factors include:  Pregnancy and lactation. Women who are pregnant may become malnourished if they do not increase their nutrient intake. They are also susceptible to folic acid deficiency.  Increasing age. The body's ability to absorb nutrients decreases with age. This can contribute to iron, calcium, and vitamin D deficiencies.  Alcohol or drug dependency. Addiction often leads to a lifestyle in which proper nourishment is ignored.  Dependency can also hurt the metabolism and the body's ability to absorb nutrients. Alcoholism is a major cause of thiamine deficiency and can lead to deficiencies of magnesium, zinc, and other vitamins.  Eating disorders, such as anorexia nervosa. People with these disorders may eat too little or too much.  Chewing or swallowing problems. People with these disorders may not eat enough.  Certain diseases, including:  Long-lasting (chronic) diseases. Chronic diseases tend to affect the absorption of calcium, iron, and vitamins B12, A, D, E, and K.  Liver disease. Liver disease affects the storage of vitamins A and B12. It also interferes with the metabolism of protein and energy sources.  Kidney disease. Kidney disease may cause deficiencies of protein, iron, and vitamin D.  Cancer or AIDS. These diseases can cause a loss of appetite.  Cystic fibrosis. This disease can make it difficult for the body to absorb nutrients.  Certain diets, including.  The vegetarian diet. Vegetarians are at risk for iron deficiency.  The vegan diet. Vegans are susceptible to vitamin B12, calcium, iron, vitamin D, and zinc deficiencies.  The fruitarian diet. This diet can be deficient in protein, sodium, and many micronutrients.  Many commercial "fad" diets, including those that claim to enhance well-being and reduce weight.  Very low calorie diets.  Low income. People with a low income may have trouble paying for nutritious foods. SIGNS AND SYMPTOMS Signs and symptoms depend on the kind of malnutrition you have. Common symptoms include:  Fatigue.  Weakness.  Dizziness.  Fainting  Weight loss.  Poor immune response.  Lack of menstruation.  Hair loss.  Poor memory. DIAGNOSIS  Malnutrition may be diagnosed by:  A medical history.  A dietary history.  A physical exam. This may include a measurement of your body mass index (BMI).  Blood tests. TREATMENT  Treatments vary depending  on the cause of the malnutrition. Common treatments include:  Dietary changes.  Dietary supplements, such as vitamins and minerals.  Treatment of any underlying conditions. HOME CARE INSTRUCTIONS  Eat a balanced diet.  Take dietary supplements as directed by your health care provider.  Exercise regularly. Exercising can improve appetite.  Keep all follow-up visits as directed by your health care provider. This is important. PREVENTION Eating a well-balanced diet helps to prevent most forms of malnutrition. SEEK MEDICAL CARE IF:  You have increased weakness or fatigue.  You faint.  You stop menstruating.  You have rapid hair loss.  You have unexpected weight loss.   This information is not intended to replace advice given to you by your health care provider. Make sure you discuss any questions you have with your health care provider.   Document Released: 06/17/2005 Document Revised: 08/22/2014 Document Reviewed: 03/28/2014 Elsevier Interactive Patient Education 2016 [employer] Inc[employer]  Pneumococcal Polysaccharide Vaccine: What You Need to Know 1.  Why get vaccinated? Vaccination can protect older adults (and some children and younger adults) from pneumococcal disease. Pneumococcal disease is caused by bacteria that can spread from person to person through close contact. It can cause ear infections, and it can also lead to more serious infections of the:   Lungs (pneumonia),  Blood (bacteremia), and  Covering of the brain and spinal cord (meningitis). Meningitis can cause deafness and brain damage, and it can be fatal. Anyone can get pneumococcal disease, but children under 432 years of age, people with certain medical conditions, adults over 165 years of age, and cigarette smokers are at the highest risk. About 18,000 older adults die each year from pneumococcal disease in the [place]. Treatment of pneumococcal infections with penicillin and other drugs used to be more  effective. But some strains of the disease have become resistant to these drugs. This makes prevention of the disease, through vaccination, even more important. 2. Pneumococcal polysaccharide vaccine (PPSV23) Pneumococcal polysaccharide vaccine (PPSV23) protects against 23 types of pneumococcal bacteria. It will not prevent all pneumococcal disease. PPSV23 is recommended for:  All adults 55 years of age and older,  Anyone 2 through 55 years of age with certain long-term health problems,  Anyone 2 through 55 years of age with a weakened immune system,  Adults 6519 through 55 years of age who smoke cigarettes or have asthma. Most people need only one dose of PPSV. A second dose is recommended for certain high-risk groups. People 2665 and older should get a dose even if they have gotten one or more doses of the vaccine before they turned 65. Your healthcare provider can give you more information about these recommendations. Most healthy adults develop protection within 2 to 3 weeks of getting the shot. 3. Some people should not get this vaccine  Anyone who has had a life-threatening allergic reaction to PPSV should not get another dose.  Anyone who has a severe allergy to any component of PPSV should not receive it. Tell your provider if you have any severe allergies.  Anyone who is moderately or severely ill when the shot is scheduled may be asked to wait until they recover before getting the vaccine. Someone with a mild illness can usually be vaccinated.  Children less than 592 years of age should not receive this vaccine.  There is no evidence that PPSV is harmful to either a pregnant woman or to her fetus. However, as a precaution, women who need the vaccine should be vaccinated before becoming pregnant, if possible. 4. Risks of a vaccine reaction With any medicine, including vaccines, there is a chance of side effects. These are usually mild and go away on their own, but serious reactions are  also possible. About half of people who get PPSV have mild side effects, such as redness or pain where the shot is given, which go away within about two days. Less than 1 out of 100 people develop a fever, muscle aches, or more severe local reactions. Problems that could happen after any vaccine:  People sometimes faint after a medical procedure, including vaccination. Sitting or lying down for about 15 minutes can help prevent fainting, and injuries caused by a fall. Tell your doctor if you feel dizzy, or have vision changes or ringing in the ears.  Some people get severe pain in the shoulder and have difficulty moving the arm where a shot was given. This happens very rarely.  Any medication can cause a severe allergic reaction. Such reactions  from a vaccine are very rare, estimated at about 1 in a million doses, and would happen within a few minutes to a few hours after the vaccination. As with any medicine, there is a very remote chance of a vaccine causing a serious injury or death. The safety of vaccines is always being monitored. For more information, visit: http://floyd.org/ 5. What if there is a serious reaction? What should I look for? Look for anything that concerns you, such as signs of a severe allergic reaction, very high fever, or unusual behavior.  Signs of a severe allergic reaction can include hives, swelling of the face and throat, difficulty breathing, a fast heartbeat, dizziness, and weakness. These would usually start a few minutes to a few hours after the vaccination. What should I do? If you think it is a severe allergic reaction or other emergency that can't wait, call 9-1-1 or get to the nearest hospital. Otherwise, call your doctor. Afterward, the reaction should be reported to the Vaccine Adverse Event Reporting System (VAERS). Your doctor might file this report, or you can do it yourself through the VAERS web site at www.vaers.LAgents.no, or by calling  1-(614)104-6154.  VAERS does not give medical advice. 6. How can I learn more?  Ask your doctor. He or she can give you the vaccine package insert or suggest other sources of information.  Call your local or state health department.  Contact the Centers for Disease Control and Prevention (CDC):  Call 351 096 4806 (1-800-CDC-INFO) or  Visit CDC's website at PicCapture.uy CDC Pneumococcal Polysaccharide Vaccine VIS (12/06/13)   This information is not intended to replace advice given to you by your health care provider. Make sure you discuss any questions you have with your health care provider.   Document Released: 05/29/2006 Document Revised: 08/22/2014 Document Reviewed: 12/09/2013 Elsevier Interactive Patient Education 2016 [employer]. Tdap Vaccine (Tetanus, Diphtheria and Pertussis): What You Need to Know 1. Why get vaccinated? Tetanus, diphtheria and pertussis are very serious diseases. Tdap vaccine can protect [place] from these diseases. And, Tdap vaccine given to pregnant women can protect newborn babies against pertussis. TETANUS (Lockjaw) is rare in the [place] today. It causes painful muscle tightening and stiffness, usually all over the body.  It can lead to tightening of muscles in the head and neck so you can't open your mouth, swallow, or sometimes even breathe. Tetanus kills about 1 out of 10 people who are infected even after receiving the best medical care. DIPHTHERIA is also rare in the [place] today. It can cause a thick coating to form in the back of the throat.  It can lead to breathing problems, heart failure, paralysis, and death. PERTUSSIS (Whooping Cough) causes severe coughing spells, which can cause difficulty breathing, vomiting and disturbed sleep.  It can also lead to weight loss, incontinence, and rib fractures. Up to 2 in 100 adolescents and 5 in 100 adults with pertussis are hospitalized or have complications, which could include  pneumonia or death. These diseases are caused by bacteria. Diphtheria and pertussis are spread from person to person through secretions from coughing or sneezing. Tetanus enters the body through cuts, scratches, or wounds. Before vaccines, as many as 200,000 cases of diphtheria, 200,000 cases of pertussis, and hundreds of cases of tetanus, were reported in the [place] each year. Since vaccination began, reports of cases for tetanus and diphtheria have dropped by about 99% and for pertussis by about 80%. 2. Tdap vaccine Tdap vaccine can protect adolescents and adults  from tetanus, diphtheria, and pertussis. One dose of Tdap is routinely given at age 69 or 49. People who did not get Tdap at that age should get it as soon as possible. Tdap is especially important for healthcare professionals and anyone having close contact with a baby younger than 12 months. Pregnant women should get a dose of Tdap during every pregnancy, to protect the newborn from pertussis. Infants are most at risk for severe, life-threatening complications from pertussis. Another vaccine, called Td, protects against tetanus and diphtheria, but not pertussis. A Td booster should be given every 10 years. Tdap may be given as one of these boosters if you have never gotten Tdap before. Tdap may also be given after a severe cut or burn to prevent tetanus infection. Your doctor or the person giving you the vaccine can give you more information. Tdap may safely be given at the same time as other vaccines. 3. Some people should not get this vaccine  A person who has ever had a life-threatening allergic reaction after a previous dose of any diphtheria, tetanus or pertussis containing vaccine, OR has a severe allergy to any part of this vaccine, should not get Tdap vaccine. Tell the person giving the vaccine about any severe allergies.  Anyone who had coma or long repeated seizures within 7 days after a childhood dose of DTP or DTaP, or a  previous dose of Tdap, should not get Tdap, unless a cause other than the vaccine was found. They can still get Td.  Talk to your doctor if you:  have seizures or another nervous system problem,  had severe pain or swelling after any vaccine containing diphtheria, tetanus or pertussis,  ever had a condition called Guillain-Barr Syndrome (GBS),  aren't feeling well on the day the shot is scheduled. 4. Risks With any medicine, including vaccines, there is a chance of side effects. These are usually mild and go away on their own. Serious reactions are also possible but are rare. Most people who get Tdap vaccine do not have any problems with it. Mild problems following Tdap (Did not interfere with activities)  Pain where the shot was given (about 3 in 4 adolescents or 2 in 3 adults)  Redness or swelling where the shot was given (about 1 person in 5)  Mild fever of at least 100.50F (up to about 1 in 25 adolescents or 1 in 100 adults)  Headache (about 3 or 4 people in 10)  Tiredness (about 1 person in 3 or 4)  Nausea, vomiting, diarrhea, stomach ache (up to 1 in 4 adolescents or 1 in 10 adults)  Chills, sore joints (about 1 person in 10)  Body aches (about 1 person in 3 or 4)  Rash, swollen glands (uncommon) Moderate problems following Tdap (Interfered with activities, but did not require medical attention)  Pain where the shot was given (up to 1 in 5 or 6)  Redness or swelling where the shot was given (up to about 1 in 16 adolescents or 1 in 12 adults)  Fever over 102F (about 1 in 100 adolescents or 1 in 250 adults)  Headache (about 1 in 7 adolescents or 1 in 10 adults)  Nausea, vomiting, diarrhea, stomach ache (up to 1 or 3 people in 100)  Swelling of the entire arm where the shot was given (up to about 1 in 500). Severe problems following Tdap (Unable to perform usual activities; required medical attention)  Swelling, severe pain, bleeding and redness in the arm  where the shot was given (rare). Problems that could happen after any vaccine:  People sometimes faint after a medical procedure, including vaccination. Sitting or lying down for about 15 minutes can help prevent fainting, and injuries caused by a fall. Tell your doctor if you feel dizzy, or have vision changes or ringing in the ears.  Some people get severe pain in the shoulder and have difficulty moving the arm where a shot was given. This happens very rarely.  Any medication can cause a severe allergic reaction. Such reactions from a vaccine are very rare, estimated at fewer than 1 in a million doses, and would happen within a few minutes to a few hours after the vaccination. As with any medicine, there is a very remote chance of a vaccine causing a serious injury or death. The safety of vaccines is always being monitored. For more information, visit: http://floyd.org/ 5. What if there is a serious problem? What should I look for?  Look for anything that concerns you, such as signs of a severe allergic reaction, very high fever, or unusual behavior.  Signs of a severe allergic reaction can include hives, swelling of the face and throat, difficulty breathing, a fast heartbeat, dizziness, and weakness. These would usually start a few minutes to a few hours after the vaccination. What should I do?  If you think it is a severe allergic reaction or other emergency that can't wait, call 9-1-1 or get the person to the nearest hospital. Otherwise, call your doctor.  Afterward, the reaction should be reported to the Vaccine Adverse Event Reporting System (VAERS). Your doctor might file this report, or you can do it yourself through the VAERS web site at www.vaers.LAgents.no, or by calling 1-(716) 090-7161. VAERS does not give medical advice.  6. The National Vaccine Injury Compensation Program The [employer] Vaccine Injury Compensation Program (VICP) is a federal program that was created to  compensate people who may have been injured by certain vaccines. Persons who believe they may have been injured by a vaccine can learn about the program and about filing a claim by calling 1-(567)470-9964 or visiting the VICP website at SpiritualWord.at. There is a time limit to file a claim for compensation. 7. How can I learn more?  Ask your doctor. He or she can give you the vaccine package insert or suggest other sources of information.  Call your local or state health department.  Contact the Centers for Disease Control and Prevention (CDC):  Call 813 295 3660 (1-800-CDC-INFO) or  Visit CDC's website at PicCapture.uy CDC Tdap Vaccine VIS (10/08/13)   This information is not intended to replace advice given to you by your health care provider. Make sure you discuss any questions you have with your health care provider.   Document Released: 01/31/2012 Document Revised: 08/22/2014 Document Reviewed: 11/13/2013 Elsevier Interactive Patient Education 2016 Elsevier Inc. Influenza Virus Vaccine injection (Fluarix) What is this medicine? INFLUENZA VIRUS VACCINE (in floo EN zuh VAHY ruhs vak SEEN) helps to reduce the risk of getting influenza also known as the flu. This medicine may be used for other purposes; ask your health care provider or pharmacist if you have questions. What should I tell my health care provider before I take this medicine? They need to know if you have any of these conditions: -bleeding disorder like hemophilia -fever or infection -Guillain-Barre syndrome or other neurological problems -immune system problems -infection with the human immunodeficiency virus (HIV) or AIDS -low blood platelet counts -multiple sclerosis -an unusual or allergic reaction  to influenza virus vaccine, eggs, chicken proteins, latex, gentamicin, other medicines, foods, dyes or preservatives -pregnant or trying to get pregnant -breast-feeding How should I use this  medicine? This vaccine is for injection into a muscle. It is given by a health care professional. A copy of Vaccine Information Statements will be given before each vaccination. Read this sheet carefully each time. The sheet may change frequently. Talk to your pediatrician regarding the use of this medicine in children. Special care may be needed. Overdosage: If you think you have taken too much of this medicine contact a poison control center or emergency room at once. NOTE: This medicine is only for you. Do not share this medicine with others. What if I miss a dose? This does not apply. What may interact with this medicine? -chemotherapy or radiation therapy -medicines that lower your immune system like etanercept, anakinra, infliximab, and adalimumab -medicines that treat or prevent blood clots like warfarin -phenytoin -steroid medicines like prednisone or cortisone -theophylline -vaccines This list may not describe all possible interactions. Give your health care provider a list of all the medicines, herbs, non-prescription drugs, or dietary supplements you use. Also tell them if you smoke, drink alcohol, or use illegal drugs. Some items may interact with your medicine. What should I watch for while using this medicine? Report any side effects that do not go away within 3 days to your doctor or health care professional. Call your health care provider if any unusual symptoms occur within 6 weeks of receiving this vaccine. You may still catch the flu, but the illness is not usually as bad. You cannot get the flu from the vaccine. The vaccine will not protect against colds or other illnesses that may cause fever. The vaccine is needed every year. What side effects may I notice from receiving this medicine? Side effects that you should report to your doctor or health care professional as soon as possible: -allergic reactions like skin rash, itching or hives, swelling of the face, lips, or  tongue Side effects that usually do not require medical attention (report to your doctor or health care professional if they continue or are bothersome): -fever -headache -muscle aches and pains -pain, tenderness, redness, or swelling at site where injected -weak or tired This list may not describe all possible side effects. Call your doctor for medical advice about side effects. You may report side effects to FDA at 1-800-FDA-1088. Where should I keep my medicine? This vaccine is only given in a clinic, pharmacy, doctor's office, or other health care setting and will not be stored at home. NOTE: This sheet is a summary. It may not cover all possible information. If you have questions about this medicine, talk to your doctor, pharmacist, or health care provider.    2016, Elsevier/Gold Standard. (2008-02-27 09:30:40)

## 2016-05-10 NOTE — Telephone Encounter (Signed)
Patient was called after visit with no answer and unable to leave message. Alternate number called and left message for patient to call.  Patient needs appointment for lab visit only.

## 2016-05-10 NOTE — Progress Notes (Signed)
Pt is in the office today for a new patient visit Pt states she is not in any pain Pt states she has a rash Pt states she has had the rash for a couple of months

## 2016-05-11 LAB — HIV ANTIBODY (ROUTINE TESTING W REFLEX): HIV: NONREACTIVE

## 2016-05-11 LAB — HEPATITIS C ANTIBODY: HCV AB: NEGATIVE

## 2016-05-11 LAB — VITAMIN D 25 HYDROXY (VIT D DEFICIENCY, FRACTURES): VIT D 25 HYDROXY: 23 ng/mL — AB (ref 30–100)

## 2016-05-11 LAB — PREALBUMIN: Prealbumin: 15 mg/dL — ABNORMAL LOW (ref 17–34)

## 2016-05-12 ENCOUNTER — Other Ambulatory Visit: Payer: Self-pay | Admitting: Internal Medicine

## 2016-05-12 MED ORDER — VITAMIN D (ERGOCALCIFEROL) 1.25 MG (50000 UNIT) PO CAPS: 50000.0000 [IU] | ORAL_CAPSULE | ORAL | 0 refills | Status: DC

## 2016-05-19 ENCOUNTER — Telehealth: Payer: Self-pay

## 2016-05-19 NOTE — Telephone Encounter (Signed)
Contacted pt to go over lab results pt is aware of results and doesn't have any questions or concerns 

## 2016-05-20 ENCOUNTER — Ambulatory Visit: Payer: Self-pay | Attending: Internal Medicine

## 2016-05-20 MED FILL — VIT D2 1.25 MG (50,000 UNIT: 1.25 MG | 28 days supply | Qty: 4 | Fill #0

## 2016-05-25 ENCOUNTER — Encounter: Payer: Self-pay | Admitting: Internal Medicine

## 2016-05-25 ENCOUNTER — Encounter: Payer: Self-pay | Admitting: Gastroenterology

## 2016-05-25 ENCOUNTER — Ambulatory Visit: Payer: Self-pay | Attending: Internal Medicine | Admitting: Internal Medicine

## 2016-05-25 VITALS — BP 122/83 | HR 72 | Temp 98.4°F | Resp 16 | Wt 93.4 lb

## 2016-05-25 DIAGNOSIS — H6122 Impacted cerumen, left ear: Secondary | ICD-10-CM

## 2016-05-25 DIAGNOSIS — F1010 Alcohol abuse, uncomplicated: Secondary | ICD-10-CM

## 2016-05-25 DIAGNOSIS — F17200 Nicotine dependence, unspecified, uncomplicated: Secondary | ICD-10-CM

## 2016-05-25 DIAGNOSIS — E440 Moderate protein-calorie malnutrition: Secondary | ICD-10-CM

## 2016-05-25 DIAGNOSIS — Z681 Body mass index (BMI) 19 or less, adult: Secondary | ICD-10-CM | POA: Insufficient documentation

## 2016-05-25 DIAGNOSIS — F17210 Nicotine dependence, cigarettes, uncomplicated: Secondary | ICD-10-CM | POA: Insufficient documentation

## 2016-05-25 DIAGNOSIS — Z124 Encounter for screening for malignant neoplasm of cervix: Secondary | ICD-10-CM

## 2016-05-25 DIAGNOSIS — Z885 Allergy status to narcotic agent status: Secondary | ICD-10-CM | POA: Insufficient documentation

## 2016-05-25 DIAGNOSIS — E43 Unspecified severe protein-calorie malnutrition: Secondary | ICD-10-CM | POA: Insufficient documentation

## 2016-05-25 DIAGNOSIS — Z1211 Encounter for screening for malignant neoplasm of colon: Secondary | ICD-10-CM

## 2016-05-25 NOTE — Patient Instructions (Signed)
Alcohol Use Disorder Alcohol use disorder is a mental disorder. It is not a one-time incident of heavy drinking. Alcohol use disorder is the excessive and uncontrollable use of alcohol over time that leads to problems with functioning in one or more areas of daily living. People with this disorder risk harming themselves and others when they drink to excess. Alcohol use disorder also can cause other mental disorders, such as mood and anxiety disorders, and serious physical problems. People with alcohol use disorder often misuse other drugs.  Alcohol use disorder is common and widespread. Some people with this disorder drink alcohol to cope with or escape from negative life events. Others drink to relieve chronic pain or symptoms of mental illness. People with a family history of alcohol use disorder are at higher risk of losing control and using alcohol to excess.  Drinking too much alcohol can cause injury, accidents, and health problems. One drink can be too much when you are:  Working.  Pregnant or breastfeeding.  Taking medicines. Ask your doctor.  Driving or planning to drive. SYMPTOMS  Signs and symptoms of alcohol use disorder may include the following:   Consumption ofalcohol inlarger amounts or over a longer period of time than intended.  Multiple unsuccessful attempts to cutdown or control alcohol use.   A great deal of time spent obtaining alcohol, using alcohol, or recovering from the effects of alcohol (hangover).  A strong desire or urge to use alcohol (cravings).   Continued use of alcohol despite problems at work, school, or home because of alcohol use.   Continued use of alcohol despite problems in relationships because of alcohol use.  Continued use of alcohol in situations when it is physically hazardous, such as driving a car.  Continued use of alcohol despite awareness of a physical or psychological problem that is likely related to alcohol use. Physical  problems related to alcohol use can involve the brain, heart, liver, stomach, and intestines. Psychological problems related to alcohol use include intoxication, depression, anxiety, psychosis, delirium, and dementia.   The need for increased amounts of alcohol to achieve the same desired effect, or a decreased effect from the consumption of the same amount of alcohol (tolerance).  Withdrawal symptoms upon reducing or stopping alcohol use, or alcohol use to reduce or avoid withdrawal symptoms. Withdrawal symptoms include:  Racing heart.  Hand tremor.  Difficulty sleeping.  Nausea.  Vomiting.  Hallucinations.  Restlessness.  Seizures. DIAGNOSIS Alcohol use disorder is diagnosed through an assessment by your health care provider. Your health care provider may start by asking three or four questions to screen for excessive or problematic alcohol use. To confirm a diagnosis of alcohol use disorder, at least two symptoms must be present within a 47-monthperiod. The severity of alcohol use disorder depends on the number of symptoms:  Mild--two or three.  Moderate--four or five.  Severe--six or more. Your health care provider may perform a physical exam or use results from lab tests to see if you have physical problems resulting from alcohol use. Your health care provider may refer you to a mental health professional for evaluation. TREATMENT  Some people with alcohol use disorder are able to reduce their alcohol use to low-risk levels. Some people with alcohol use disorder need to quit drinking alcohol. When necessary, mental health professionals with specialized training in substance use treatment can help. Your health care provider can help you decide how severe your alcohol use disorder is and what type of treatment you need.  The following forms of treatment are available:   Detoxification. Detoxification involves the use of prescription medicines to prevent alcohol withdrawal  symptoms in the first week after quitting. This is important for people with a history of symptoms of withdrawal and for heavy drinkers who are likely to have withdrawal symptoms. Alcohol withdrawal can be dangerous and, in severe cases, cause death. Detoxification is usually provided in a hospital or in-patient substance use treatment facility.  Counseling or talk therapy. Talk therapy is provided by substance use treatment counselors. It addresses the reasons people use alcohol and ways to keep them from drinking again. The goals of talk therapy are to help people with alcohol use disorder find healthy activities and ways to cope with life stress, to identify and avoid triggers for alcohol use, and to handle cravings, which can cause relapse.  Medicines.Different medicines can help treat alcohol use disorder through the following actions:  Decrease alcohol cravings.  Decrease the positive reward response felt from alcohol use.  Produce an uncomfortable physical reaction when alcohol is used (aversion therapy).  Support groups. Support groups are run by people who have quit drinking. They provide emotional support, advice, and guidance. These forms of treatment are often combined. Some people with alcohol use disorder benefit from intensive combination treatment provided by specialized substance use treatment centers. Both inpatient and outpatient treatment programs are available.   This information is not intended to replace advice given to you by your health care provider. Make sure you discuss any questions you have with your health care provider.   Document Released: 09/08/2004 Document Revised: 08/22/2014 Document Reviewed: 11/08/2012 Elsevier Interactive Patient Education 2016 Elsevier Inc.  -  You Can Quit Smoking If you are ready to quit smoking or are thinking about it, congratulations! You have chosen to help yourself be healthier and live longer! There are lots of different ways  to quit smoking. Nicotine gum, nicotine patches, a nicotine inhaler, or nicotine nasal spray can help with physical craving. Hypnosis, support groups, and medicines help break the habit of smoking. TIPS TO GET OFF AND STAY OFF CIGARETTES  Learn to predict your moods. Do not let a bad situation be your excuse to have a cigarette. Some situations in your life might tempt you to have a cigarette.  Ask friends and co-workers not to smoke around you.  Make your home smoke-free.  Never have "just one" cigarette. It leads to wanting another and another. Remind yourself of your decision to quit.  On a card, make a list of your reasons for not smoking. Read it at least the same number of times a day as you have a cigarette. Tell yourself everyday, "I do not want to smoke. I choose not to smoke."  Ask someone at home or work to help you with your plan to quit smoking.  Have something planned after you eat or have a cup of coffee. Take a walk or get other exercise to perk you up. This will help to keep you from overeating.  Try a relaxation exercise to calm you down and decrease your stress. Remember, you may be tense and nervous the first two weeks after you quit. This will pass.  Find new activities to keep your hands busy. Play with a pen, coin, or rubber band. Doodle or draw things on paper.  Brush your teeth right after eating. This will help cut down the craving for the taste of tobacco after meals. You can try mouthwash too.  Try gum,   breath mints, or diet candy to keep something in your mouth. IF YOU SMOKE AND WANT TO QUIT:  Do not stock up on cigarettes. Never buy a carton. Wait until one pack is finished before you buy another.  Never carry cigarettes with you at work or at home.  Keep cigarettes as far away from you as possible. Leave them with someone else.  Never carry matches or a lighter with you.  Ask yourself, "Do I need this cigarette or is this just a reflex?"  Bet with  someone that you can quit. Put cigarette money in a piggy bank every morning. If you smoke, you give up the money. If you do not smoke, by the end of the week, you keep the money.  Keep trying. It takes 21 days to change a habit!  Talk to your doctor about using medicines to help you quit. These include nicotine replacement gum, lozenges, or skin patches.   This information is not intended to replace advice given to you by your health care provider. Make sure you discuss any questions you have with your health care provider.   Document Released: 05/28/2009 Document Revised: 10/24/2011 Document Reviewed: 05/28/2009 Elsevier Interactive Patient Education [employer].

## 2016-05-25 NOTE — Progress Notes (Signed)
Gabrielle Davis Stopher, is a 55 y.o. female  [id number]  [id number]  DOB - 11/22/1960  Chief Complaint  Patient presents with  . Gynecologic Exam        Subjective:   Gabrielle Davis Sistrunk is a 55 y.o. female here today for a follow up visit for papsmear.  Pt states she is smoking slightly less, down to 4-5 cigs/day. Drinking less, 1- 40oz beer and a can of beer, prior was drinking a 6pack, so slightly less. Denies current ivdu Asked for more ensure drinks, helped her a lot.  Last intercourse about 2 days ago, last menses 5 years ago.   Patient has No headache, No chest pain, No abdominal pain - No Nausea, No new weakness tingling or numbness, No Cough - SOB.  No problems updated.  ALLERGIES: Allergies  Allergen Reactions  . Codeine Itching    PAST MEDICAL HISTORY: Past Medical History:  Diagnosis Date  . ETOH abuse     MEDICATIONS AT HOME: Prior to Admission medications   Medication Sig Start Date End Date Taking? Authorizing Provider  carbamide peroxide (DEBROX) 6.5 % otic solution Place 5 drops into the left ear 2 (two) times daily. 05/10/16   Pete Glatterawn T Sachi Boulay, MD  hydrOXYzine (ATARAX/VISTARIL) 25 MG tablet Take 1 tablet (25 mg total) by mouth every 6 (six) hours as needed for itching. Patient not taking: Reported on 05/10/2016 08/07/14   Charm RingsErin J Honig, MD  meloxicam (MOBIC) 7.5 MG tablet Take 1 tablet (7.5 mg total) by mouth daily. Take 1-2 pills daily for 7 days, then daily as needed for pain. Patient not taking: Reported on 05/10/2016 05/28/14   Junius FinnerErin O'Malley, PA-C  methocarbamol (ROBAXIN) 500 MG tablet Take 1 tablet (500 mg total) by mouth 2 (two) times daily. Patient not taking: Reported on 05/10/2016 05/28/14   Junius FinnerErin O'Malley, PA-C  permethrin (ELIMITE) 5 % cream Apply from neck down, leave in place 8-12 hours, then wash off.  Repeat dose in 1 week. Patient not taking: Reported on 05/10/2016 08/07/14   Charm RingsErin J Honig, MD  traMADol (ULTRAM) 50 MG tablet Take 1 tablet (50  mg total) by mouth every 6 (six) hours as needed. Patient not taking: Reported on 05/10/2016 05/28/14   Junius FinnerErin O'Malley, PA-C  triamcinolone cream (KENALOG) 0.1 % Apply 1 application topically 2 (two) times daily as needed. For itching Patient not taking: Reported on 05/10/2016 08/07/14   Charm RingsErin J Honig, MD  Vitamin D, Ergocalciferol, (DRISDOL) 50000 units CAPS capsule Take 1 capsule (50,000 Units total) by mouth every 7 (seven) days. 05/12/16   Pete Glatterawn T Linda Grimmer, MD     Objective:   Vitals:   05/25/16 1146  BP: 122/83  Pulse: 72  Resp: 16  Temp: 98.4 F (36.9 C)  TempSrc: Oral  SpO2: 100%  Weight: 93 lb 6.4 oz (42.4 kg)    Exam General appearance : Awake, alert, not in any distress. Speech Clear. Not toxic looking, cachetic HEENT: Atraumatic and Normocephalic, pupils equally reactive to light.  +left ear cerumen impaction. Neck: supple, no JVD.  Breast /axilla: bilat nml appearance, not dippling noted. No palpable masses/nodules/nipple discharge noted on exam  Chest:Good air entry bilaterally, no added sounds. CVS: S1 S2 regular, no murmurs/gallups or rubs. Abdomen: Bowel sounds active, Non tender Pelvic Exam: Cervix normal in appearance, external genitalia normal, no adnexal masses or tenderness, no cervical motion tenderness, rectovaginal septum normal, uterus normal size, shape, and consistency and vagina normal without discharge   Extremities: B/L Lower Ext shows no  edema, both legs are warm to touch, diffuse muscle atrophy throughout. Neurology: Awake alert, and oriented X 3, CN II-XII grossly intact, Non focal Skin:No Rash  Data Review No results found for: HGBA1C  Depression screen PHQ 2/9 05/10/2016  Decreased Interest 0  Down, Depressed, Hopeless 0  PHQ - 2 Score 0      Assessment & Plan   1. Pap smear for cervical cancer screening - Cytology - PAP  2. Moderate - severe protein-calorie malnutrition (HCC) Provided more protein shakes today  3. Smoking  addiction Total cessation recd  4. ETOH abuse Recd taper to complete cessation. Affecting her malnutrition status greatly.  5. Ceruminosis, left - Ear Lavage - with good resolution/  6. Colon cancer screening - Ambulatory referral to Gastroenterology     Patient have been counseled extensively about nutrition and exercise  Return in about 2 months (around 07/25/2016).  The patient was given clear instructions to go to ER or return to medical center if symptoms don't improve, worsen or new problems develop. The patient verbalized understanding. The patient was told to call to get lab results if they haven't heard anything in the next week.   This note has been created with Education officer, environmental. Any transcriptional errors are unintentional.   Pete Glatter, MD, MBA/MHA [facility] [place], [place] [phone]   05/25/2016, 12:17 PM

## 2016-05-25 NOTE — Progress Notes (Signed)
Pt is in the office today for pap

## 2016-05-26 LAB — CYTOLOGY - PAP

## 2016-05-26 LAB — CERVICOVAGINAL ANCILLARY ONLY
CHLAMYDIA, DNA PROBE: NEGATIVE
NEISSERIA GONORRHEA: NEGATIVE
Wet Prep (BD Affirm): POSITIVE — AB

## 2016-05-27 LAB — CERVICOVAGINAL ANCILLARY ONLY: HERPES (WINDOWPATH): NEGATIVE

## 2016-06-04 ENCOUNTER — Other Ambulatory Visit: Payer: Self-pay | Admitting: Internal Medicine

## 2016-06-04 MED ORDER — METRONIDAZOLE 500 MG PO TABS: 500.0000 mg | ORAL_TABLET | Freq: Two times a day (BID) | ORAL | 0 refills | Status: DC

## 2016-06-06 MED FILL — ?METRONIDAZOLE 500 MG TABLE: 500 | 7 days supply | Qty: 14 | Fill #0

## 2016-06-08 ENCOUNTER — Telehealth: Payer: Self-pay

## 2016-06-08 NOTE — Telephone Encounter (Signed)
Contacted pt to go over lab results pt is aware of results and doesn't have any questions or concerns 

## 2016-07-13 ENCOUNTER — Encounter: Payer: Self-pay | Admitting: Internal Medicine

## 2016-07-21 ENCOUNTER — Encounter: Payer: Self-pay | Admitting: Gastroenterology

## 2016-09-08 ENCOUNTER — Telehealth: Payer: Self-pay

## 2016-09-08 ENCOUNTER — Ambulatory Visit: Payer: Self-pay

## 2016-09-08 VITALS — Ht 65.0 in | Wt 97.0 lb

## 2016-09-08 DIAGNOSIS — Z1211 Encounter for screening for malignant neoplasm of colon: Secondary | ICD-10-CM

## 2016-09-08 NOTE — Progress Notes (Signed)
No allergies to eggs or soy No past problems with anesthesia No diet meds No home oxygen  Declined emmi 

## 2016-09-08 NOTE — Telephone Encounter (Signed)
Will call patient when I have more samples

## 2016-09-08 NOTE — Telephone Encounter (Signed)
LESLIE      Please save pt a Suprep sample by next week.  Thanks.  Pt will call you if you dont call her.                                           Thanks, Marylene LandAngela

## 2016-09-20 ENCOUNTER — Ambulatory Visit (AMBULATORY_SURGERY_CENTER): Payer: Self-pay | Admitting: Gastroenterology

## 2016-09-20 ENCOUNTER — Encounter: Payer: Self-pay | Admitting: Gastroenterology

## 2016-09-20 VITALS — BP 135/88 | HR 57 | Temp 97.3°F | Resp 12 | Ht 65.0 in | Wt 97.0 lb

## 2016-09-20 DIAGNOSIS — Z1212 Encounter for screening for malignant neoplasm of rectum: Secondary | ICD-10-CM

## 2016-09-20 DIAGNOSIS — Z1211 Encounter for screening for malignant neoplasm of colon: Secondary | ICD-10-CM

## 2016-09-20 MED ORDER — SODIUM CHLORIDE 0.9 % IV SOLN: 500.0000 mL | INTRAVENOUS | Status: DC

## 2016-09-20 NOTE — Progress Notes (Signed)
Spontaneous respirations throughout. VSS. Resting comfortably. To PACU on room air. Report to  Sara RN. 

## 2016-09-20 NOTE — Patient Instructions (Signed)
YOU HAD AN ENDOSCOPIC PROCEDURE TODAY AT THE [facility]:   Refer to the procedure report that was given to you for any specific questions about what was found during the examination.  If the procedure report does not answer your questions, please call your gastroenterologist to clarify.  If you requested that your care partner not be given the details of your procedure findings, then the procedure report has been included in a sealed envelope for you to review at your convenience later.  YOU SHOULD EXPECT: Some feelings of bloating in the abdomen. Passage of more gas than usual.  Walking can help get rid of the air that was put into your GI tract during the procedure and reduce the bloating. If you had a lower endoscopy (such as a colonoscopy or flexible sigmoidoscopy) you may notice spotting of blood in your stool or on the toilet paper. If you underwent a bowel prep for your procedure, you may not have a normal bowel movement for a few days.  Please Note:  You might notice some irritation and congestion in your nose or some drainage.  This is from the oxygen used during your procedure.  There is no need for concern and it should clear up in a day or so.  SYMPTOMS TO REPORT IMMEDIATELY:   Following lower endoscopy (colonoscopy or flexible sigmoidoscopy):  Excessive amounts of blood in the stool  Significant tenderness or worsening of abdominal pains  Swelling of the abdomen that is new, acute  Fever of 100F or higher  For urgent or emergent issues, a gastroenterologist can be reached at any hour by calling [phone].  DIET:  We do recommend a small meal at first, but then you may proceed to your regular diet.  Drink plenty of fluids but you should avoid alcoholic beverages for 24 hours.  ACTIVITY:  You should plan to take it easy for the rest of today and you should NOT DRIVE or use heavy machinery until tomorrow (because of the sedation medicines used during the test).     FOLLOW UP: Our staff will call the number listed on your records the next business day following your procedure to check on you and address any questions or concerns that you may have regarding the information given to you following your procedure. If we do not reach you, we will leave a message.  However, if you are feeling well and you are not experiencing any problems, there is no need to return our call.  We will assume that you have returned to your regular daily activities without incident.  SIGNATURES/CONFIDENTIALITY: You and/or your care partner have signed paperwork which will be entered into your electronic medical record.  These signatures attest to the fact that that the information above on your After Visit Summary has been reviewed and is understood.  Full responsibility of the confidentiality of this discharge information lies with you and/or your care-partner.  Next colonoscopy- 10 years  Please continue your normal medications   

## 2016-09-20 NOTE — Progress Notes (Signed)
No changes in medical hx since PV. No hx of sleep apnea or use of oxygen at home

## 2016-09-20 NOTE — Op Note (Signed)
[facility] Patient Name: Gabrielle Davis Procedure Date: 09/20/2016 11:34 AM MRN: [id number] Endoscopist: Sherilyn Cooter L. Myrtie Neither , MD Age: 56 Referring MD:  Date of Birth: 01-27-1961 Gender: Female Account #: [id number] Procedure:                Colonoscopy Indications:              Screening for colorectal malignant neoplasm, This                            is the patient's first colonoscopy Medicines:                Monitored Anesthesia Care Procedure:                Pre-Anesthesia Assessment:                           - Prior to the procedure, a History and Physical                            was performed, and patient medications and                            allergies were reviewed. The patient's tolerance of                            previous anesthesia was also reviewed. The risks                            and benefits of the procedure and the sedation                            options and risks were discussed with the patient.                            All questions were answered, and informed consent                            was obtained. Prior Anticoagulants: The patient has                            taken no previous anticoagulant or antiplatelet                            agents. ASA Grade Assessment: II - A patient with                            mild systemic disease. After reviewing the risks                            and benefits, the patient was deemed in                            satisfactory condition to undergo the procedure.  After obtaining informed consent, the colonoscope                            was passed under direct vision. Throughout the                            procedure, the patient's blood pressure, pulse, and                            oxygen saturations were monitored continuously. The                            Model PCF-H190DL [id number]) scope was introduced                            through the anus and  advanced to the the cecum,                            identified by appendiceal orifice and ileocecal                            valve. The colonoscopy was performed without                            difficulty. The patient tolerated the procedure                            well. The quality of the bowel preparation was                            excellent. The ileocecal valve, appendiceal                            orifice, and rectum were photographed. The quality                            of the bowel preparation was evaluated using the                            BBPS [facility] Bowel Preparation Scale) with scores                            of: Right Colon = 3, Transverse Colon = 3 and Left                            Colon = 3 (entire mucosa seen well with no residual                            staining, small fragments of stool or opaque                            liquid). The total BBPS score equals 9. The bowel  preparation used was SUPREP. Scope In: 11:47:14 AM Scope Out: 11:59:51 AM Scope Withdrawal Time: 0 hours 8 minutes 3 seconds  Total Procedure Duration: 0 hours 12 minutes 37 seconds  Findings:                 The perianal and digital rectal examinations were                            normal.                           The entire examined colon appeared normal on direct                            and retroflexion views. Complications:            No immediate complications. Estimated Blood Loss:     Estimated blood loss: none. Impression:               - The entire examined colon is normal on direct and                            retroflexion views.                           - No specimens collected. Recommendation:           - Patient has a contact number available for                            emergencies. The signs and symptoms of potential                            delayed complications were discussed with the                             patient. Return to normal activities tomorrow.                            Written discharge instructions were provided to the                            patient.                           - Resume previous diet.                           - Continue present medications.                           - Repeat colonoscopy in 10 years for screening                            purposes. Darla Mcdonald L. Myrtie Neitheranis, MD 09/20/2016 12:14:04 PM This report has been signed electronically.

## 2016-09-21 ENCOUNTER — Telehealth: Payer: Self-pay

## 2016-09-21 NOTE — Telephone Encounter (Signed)
  Follow up Call-  Call back number 09/20/2016  Post procedure Call Back phone  # [phone]  Permission to leave phone message Yes  Some recent data might be hidden     Patient questions:  Do you have a fever, pain , or abdominal swelling? No. Pain Score  0 *  Have you tolerated food without any problems? Yes.    Have you been able to return to your normal activities? Yes.    Do you have any questions about your discharge instructions: Diet   No. Medications  No. Follow up visit  No.  Do you have questions or concerns about your Care? No.  Actions: * If pain score is 4 or above: No action needed, pain <4.

## 2016-12-29 ENCOUNTER — Encounter: Payer: Self-pay | Admitting: Internal Medicine

## 2016-12-30 ENCOUNTER — Encounter: Payer: Self-pay | Admitting: Internal Medicine

## 2017-01-02 ENCOUNTER — Ambulatory Visit: Payer: Self-pay | Attending: Internal Medicine | Admitting: Internal Medicine

## 2017-01-02 ENCOUNTER — Encounter: Payer: Self-pay | Admitting: Internal Medicine

## 2017-01-02 ENCOUNTER — Ambulatory Visit: Payer: Self-pay

## 2017-01-02 VITALS — BP 130/82 | HR 64 | Temp 98.6°F | Resp 16 | Wt 95.6 lb

## 2017-01-02 DIAGNOSIS — Z131 Encounter for screening for diabetes mellitus: Secondary | ICD-10-CM | POA: Insufficient documentation

## 2017-01-02 DIAGNOSIS — F1010 Alcohol abuse, uncomplicated: Secondary | ICD-10-CM | POA: Insufficient documentation

## 2017-01-02 DIAGNOSIS — L84 Corns and callosities: Secondary | ICD-10-CM | POA: Insufficient documentation

## 2017-01-02 DIAGNOSIS — E440 Moderate protein-calorie malnutrition: Secondary | ICD-10-CM | POA: Insufficient documentation

## 2017-01-02 DIAGNOSIS — Z1239 Encounter for other screening for malignant neoplasm of breast: Secondary | ICD-10-CM

## 2017-01-02 DIAGNOSIS — Z1231 Encounter for screening mammogram for malignant neoplasm of breast: Secondary | ICD-10-CM

## 2017-01-02 DIAGNOSIS — D649 Anemia, unspecified: Secondary | ICD-10-CM | POA: Insufficient documentation

## 2017-01-02 DIAGNOSIS — F17210 Nicotine dependence, cigarettes, uncomplicated: Secondary | ICD-10-CM | POA: Insufficient documentation

## 2017-01-02 DIAGNOSIS — Z79899 Other long term (current) drug therapy: Secondary | ICD-10-CM | POA: Insufficient documentation

## 2017-01-02 DIAGNOSIS — H538 Other visual disturbances: Secondary | ICD-10-CM | POA: Insufficient documentation

## 2017-01-02 DIAGNOSIS — F17200 Nicotine dependence, unspecified, uncomplicated: Secondary | ICD-10-CM

## 2017-01-02 LAB — POCT GLYCOSYLATED HEMOGLOBIN (HGB A1C): Hemoglobin A1C: 5.2

## 2017-01-02 NOTE — Progress Notes (Signed)
Gabrielle HummerCarolyn Peri, is a 56 y.o. female  [id number]  [id number]  DOB - 12/12/1960  Chief Complaint  Patient presents with  . Follow-up        Subjective:   Gabrielle Davis is a 56 y.o. female here today for a follow up visit, last seen 05/25/16, for mod cal malnutrition, etoh/tob/marijuana use/abuse, hx of drug abuse too but pt declines recent use. She is still drinking 40oz beer daily, smokes 1/3 ppd tob day, and occasional marijuana which helps stimulate her appetite. She tries eating more but difficult at times due to stress.  Co of blurry vision now, her current glasses rx do not seem strong enough. She has not had eye exam in long time.  Patient has No headache, No chest pain, No abdominal pain - No Nausea, No new weakness tingling or numbness, No Cough - SOB.  No problems updated.  ALLERGIES: Allergies  Allergen Reactions  . Codeine Itching    PAST MEDICAL HISTORY: Past Medical History:  Diagnosis Date  . ETOH abuse     MEDICATIONS AT HOME: Prior to Admission medications   Medication Sig Start Date End Date Taking? Authorizing Provider  carbamide peroxide (DEBROX) 6.5 % otic solution Place 5 drops into the left ear 2 (two) times daily. Patient not taking: Reported on 01/02/2017 05/10/16   Pete GlatterLangeland, Dawn T, MD  hydrOXYzine (ATARAX/VISTARIL) 25 MG tablet Take 1 tablet (25 mg total) by mouth every 6 (six) hours as needed for itching. Patient not taking: Reported on 05/10/2016 08/07/14   Charm RingsHonig, Erin J, MD  meloxicam (MOBIC) 7.5 MG tablet Take 1 tablet (7.5 mg total) by mouth daily. Take 1-2 pills daily for 7 days, then daily as needed for pain. Patient not taking: Reported on 05/10/2016 05/28/14   Junius Finner'Malley, Erin, PA-C  methocarbamol (ROBAXIN) 500 MG tablet Take 1 tablet (500 mg total) by mouth 2 (two) times daily. Patient not taking: Reported on 05/10/2016 05/28/14   Junius Finner'Malley, Erin, PA-C  metroNIDAZOLE (FLAGYL) 500 MG tablet Take 1 tablet (500 mg total) by mouth 2 (two)  times daily. Patient not taking: Reported on 01/02/2017 06/04/16   Dierdre SearlesLangeland, Dawn T, MD     Objective:   Vitals:   01/02/17 1510  BP: 130/82  Pulse: 64  Resp: 16  Temp: 98.6 F (37 C)  TempSrc: Oral  SpO2: 100%  Weight: 95 lb 9.6 oz (43.4 kg)    Exam General appearance : Awake, alert, not in any distress. Speech Clear. Not toxic looking, pleasant, very thin appearing AAF. HEENT: Atraumatic and Normocephalic, pupils equally reactive to light. Neck: supple, no JVD. No cervical lymphadenopathy.  Chest:Good air entry bilaterally, no added sounds. CVS: S1 S2 regular, no murmurs/gallups or rubs. Abdomen: Bowel sounds active, Non tender and not distended with no gaurding, rigidity or rebound. Extremities: B/L Lower Ext shows no edema, both legs are warm to touch, has some calluses/bunions noted bilat feet. Neurology: Awake alert, and oriented X 3, CN II-XII grossly intact, Non focal Skin:No Rash  Data Review Lab Results  Component Value Date   HGBA1C 5.2 01/02/2017    Depression screen [facility] LPHQ 2/9 01/02/2017 05/25/2016 05/10/2016  Decreased Interest 1 2 0  Down, Depressed, Hopeless 2 3 0  PHQ - 2 Score 3 5 0  Altered sleeping 1 3 -  Tired, decreased energy 2 2 -  Change in appetite 2 3 -  Feeling bad or failure about yourself  2 2 -  Trouble concentrating 2 3 -  Moving slowly or  fidgety/restless 2 2 -  Suicidal thoughts 2 2 -  PHQ-9 Score 16 22 -      Assessment & Plan   1. Blurry vision, bilateral Recd eye exam, cheapest alternative would be [facility] or other optometry for eye checkup. She could also try getting new rx readers - she can try them out in the pharmacy isles to see which may work better for her.  2. Moderate protein-calorie malnutrition (HCC) Multficactorial, etoh /tob cessation would help tremendously - high protein diet encoaurged - Basic metabolic panel  3. ETOH abuse Currently drinking 40oz day, recd total cessation, recd she weans down slowly to  prevent w/ds.  4. Smoking addiction For your tobacco abuse: Strongly recommend that he stop smoking back and all other inhaled products right away Call 1-800-Quit-Now to get free nicotine replacement from the state of [place]  - total cessation recd  5. Breast cancer screening - MM Digital Screening; Future - scholarship ppwk given  6. Diabetes mellitus screening - HgB A1c  = 5.2  7. Anemia, unspecified type Suspect due to protein cal malnutrition, she had normal colonoscopy 09/20/16 - repeat in 10 years. - CBC with Differential - Iron and TIBC - Ferritin  8. Foot callus - Ambulatory referral to Podiatry     Patient have been counseled extensively about nutrition and exercise  Return in about 3 months (around 04/04/2017), or if symptoms worsen or fail to improve.  The patient was given clear instructions to go to ER or return to medical center if symptoms don't improve, worsen or new problems develop. The patient verbalized understanding. The patient was told to call to get lab results if they haven't heard anything in the next week.   This note has been created with Education officer, environmental. Any transcriptional errors are unintentional.   Pete Glatter, MD, MBA/MHA [facility] [place], [place] [phone]   01/02/2017, 4:49 PM

## 2017-01-02 NOTE — Patient Instructions (Addendum)
Every 4th Tuesday in parkinglot, 230-4pm, tomorrow is next day.  For your tobacco abuse: Strongly recommend that he stop smoking back and all other inhaled products right away Call 1-800-Quit-Now to get free nicotine replacement from the state of [place]  -  Alcohol Abuse and Nutrition Alcohol abuse is any pattern of alcohol consumption that harms your health, relationships, or work. Alcohol abuse can affect how your body breaks down and absorbs nutrients from food by causing your liver to work abnormally. Additionally, many people who abuse alcohol do not eat enough carbohydrates, protein, fat, vitamins, and minerals. This can cause poor nutrition (malnutrition) and a lack of nutrients (nutrient deficiencies), which can lead to further complications. Nutrients that are commonly lacking (deficient) among people who abuse alcohol include:  Vitamins.  Vitamin A. This is stored in your liver. It is important for your vision, metabolism, and ability to fight off infections (immunity).  B vitamins. These include vitamins such as folate, thiamin, and niacin. These are important in new cell growth and maintenance.  Vitamin C. This plays an important role in iron absorption, wound healing, and immunity.  Vitamin D. This is produced by your liver, but you can also get vitamin D from food. Vitamin D is necessary for your body to absorb and use calcium.  Minerals.  Calcium. This is important for your bones and your heart and blood vessel (cardiovascular) function.  Iron. This is important for blood, muscle, and nervous system functioning.  Magnesium. This plays an important role in muscle and nerve function, and it helps to control blood sugar and blood pressure.  Zinc. This is important for the normal function of your nervous system and digestive system (gastrointestinal tract). Nutrition is an essential component of therapy for alcohol abuse. Your health care provider or dietitian will work with you  to design a plan that can help restore nutrients to your body and prevent potential complications. What is my plan? Your dietitian may develop a specific diet plan that is based on your condition and any other complications you may have. A diet plan will commonly include:  A balanced diet.  Grains: 6-8 oz per day.  Vegetables: 2-3 cups per day.  Fruits: 1-2 cups per day.  Meat and other protein: 5-6 oz per day.  Dairy: 2-3 cups per day.  Vitamin and mineral supplements. What do I need to know about alcohol and nutrition?  Consume foods that are high in antioxidants, such as grapes, berries, nuts, green tea, and dark green and orange vegetables. This can help to counteract some of the stress that is placed on your liver by consuming alcohol.  Avoid food and drinks that are high in fat and sugar. Foods such as sugared soft drinks, salty snack foods, and candy contain empty calories. This means that they lack important nutrients such as protein, fiber, and vitamins.  Eat frequent meals and snacks. Try to eat 5-6 small meals each day.  Eat a variety of fresh fruits and vegetables each day. This will help you get plenty of water, fiber, and vitamins in your diet.  Drink plenty of water and other clear fluids. Try to drink at least 48-64 oz (1.5-2 L) of water per day.  If you are a vegetarian, eat a variety of protein-rich foods. Pair whole grains with plant-based proteins at meals and snacks to obtain the greatest nutrient benefit from your food. For example, eat rice with beans, put peanut butter on whole-grain toast, or eat oatmeal with sunflower seeds.  Soak beans and whole grains overnight before cooking. This can help your body to absorb the nutrients more easily.  Include foods fortified with vitamins and minerals in your diet. Commonly fortified foods include milk, orange juice, cereal, and bread.  If you are malnourished, your dietitian may recommend a high-protein, high-calorie  diet. This may include:  2,000-3,000 calories (kilocalories) per day.  70-100 grams of protein per day.  Your health care provider may recommend a complete nutritional supplement beverage. This can help to restore calories, protein, and vitamins to your body. Depending on your condition, you may be advised to consume this instead of or in addition to meals.  Limit your intake of caffeine. Replace drinks like coffee and [place] tea with decaffeinated coffee and herbal tea.  Eat a variety of foods that are high in omega fatty acids. These include fish, nuts and seeds, and soybeans. These foods may help your liver to recover and may also stabilize your mood.  Certain medicines may cause changes in your appetite, taste, and weight. Work with your health care provider and dietitian to make any adjustments to your medicines and diet plan.  Include other healthy lifestyle choices in your daily routine.  Be physically active.  Get enough sleep.  Spend time doing activities that you enjoy.  If you are unable to take in enough food and calories by mouth, your health care provider may recommend a feeding tube. This is a tube that passes through your nose and throat, directly into your stomach. Nutritional supplement beverages can be given to you through the feeding tube to help you get the nutrients you need.  Take vitamin or mineral supplements as recommended by your health care provider. What foods can I eat? Grains  Enriched pasta. Enriched rice. Fortified whole-grain bread. Fortified whole-grain cereal. Barley. Brown rice. Quinoa. Millet. Vegetables  All fresh, frozen, and canned vegetables. Spinach. Kale. Artichoke. Carrots. Winter squash and pumpkin. Sweet potatoes. Broccoli. Cabbage. Cucumbers. Tomatoes. Sweet peppers. Green beans. Peas. Corn. Fruits  All fresh and frozen fruits. Berries. Grapes. Mango. Papaya. Guava. Cherries. Apples. Bananas. Peaches. Plums. Pineapple. Watermelon.  Cantaloupe. Oranges. Avocado. Meats and Other Protein Sources  Beef liver. Lean beef. Pork. Fresh and canned chicken. Fresh fish. Oysters. Sardines. Canned tuna. Shrimp. Eggs with yolks. Nuts and seeds. Peanut butter. Beans and lentils. Soybeans. Tofu. Dairy  Whole, low-fat, and nonfat milk. Whole, low-fat, and nonfat yogurt. Cottage cheese. Sour cream. Hard and soft cheeses. Beverages  Water. Herbal tea. Decaffeinated coffee. Decaffeinated green tea. 100% fruit juice. 100% vegetable juice. Instant breakfast shakes. Condiments  Ketchup. Mayonnaise. Mustard. Salad dressing. Barbecue sauce. Sweets and Desserts  Sugar-free ice cream. Sugar-free pudding. Sugar-free gelatin. Fats and Oils  Butter. Vegetable oil, flaxseed oil, olive oil, and walnut oil. Other  Complete nutrition shakes. Protein bars. Sugar-free gum. The items listed above may not be a complete list of recommended foods or beverages. Contact your dietitian for more options.  What foods are not recommended? Grains  Sugar-sweetened breakfast cereals. Flavored instant oatmeal. Fried breads. Vegetables  Breaded or deep-fried vegetables. Fruits  Dried fruit with added sugar. Candied fruit. Canned fruit in syrup. Meats and Other Protein Sources  Breaded or deep-fried meats. Dairy  Flavored milks. Fried cheese curds or fried cheese sticks. Beverages  Alcohol. Sugar-sweetened soft drinks. Sugar-sweetened tea. Caffeinated coffee and tea. Condiments  Sugar. Honey. Agave nectar. Molasses. Sweets and Desserts  Chocolate. Cake. Cookies. Candy. Other  Potato chips. Pretzels. Salted nuts. Candied nuts. The items listed above  may not be a complete list of foods and beverages to avoid. Contact your dietitian for more information.  This information is not intended to replace advice given to you by your health care provider. Make sure you discuss any questions you have with your health care provider. Document Released: 05/26/2005 Document  Revised: 12/09/2015 Document Reviewed: 03/04/2014 Elsevier Interactive Patient Education  2017 [employer]. -   Steps to Quit Smoking Smoking tobacco can be bad for your health. It can also affect almost every organ in your body. Smoking puts you and people around you at risk for many serious long-lasting (chronic) diseases. Quitting smoking is hard, but it is one of the best things that you can do for your health. It is never too late to quit. What are the benefits of quitting smoking? When you quit smoking, you lower your risk for getting serious diseases and conditions. They can include:  Lung cancer or lung disease.  Heart disease.  Stroke.  Heart attack.  Not being able to have children (infertility).  Weak bones (osteoporosis) and broken bones (fractures). If you have coughing, wheezing, and shortness of breath, those symptoms may get better when you quit. You may also get sick less often. If you are pregnant, quitting smoking can help to lower your chances of having a baby of low birth weight. What can I do to help me quit smoking? Talk with your doctor about what can help you quit smoking. Some things you can do (strategies) include:  Quitting smoking totally, instead of slowly cutting back how much you smoke over a period of time.  Going to in-person counseling. You are more likely to quit if you go to many counseling sessions.  Using resources and support systems, such as:  Online chats with a Veterinary surgeon.  Phone quitlines.  Printed Materials engineer.  Support groups or group counseling.  Text messaging programs.  Mobile phone apps or applications.  Taking medicines. Some of these medicines may have nicotine in them. If you are pregnant or breastfeeding, do not take any medicines to quit smoking unless your doctor says it is okay. Talk with your doctor about counseling or other things that can help you. Talk with your doctor about using more than one strategy  at the same time, such as taking medicines while you are also going to in-person counseling. This can help make quitting easier. What things can I do to make it easier to quit? Quitting smoking might feel very hard at first, but there is a lot that you can do to make it easier. Take these steps:  Talk to your family and friends. Ask them to support and encourage you.  Call phone quitlines, reach out to support groups, or work with a Veterinary surgeon.  Ask people who smoke to not smoke around you.  Avoid places that make you want (trigger) to smoke, such as:  Bars.  Parties.  Smoke-break areas at work.  Spend time with people who do not smoke.  Lower the stress in your life. Stress can make you want to smoke. Try these things to help your stress:  Getting regular exercise.  Deep-breathing exercises.  Yoga.  Meditating.  Doing a body scan. To do this, close your eyes, focus on one area of your body at a time from head to toe, and notice which parts of your body are tense. Try to relax the muscles in those areas.  Download or buy apps on your mobile phone or tablet that can help  you stick to your quit plan. There are many free apps, such as QuitGuide from the [employer] developer for Disease Control and Prevention). You can find more support from smokefree.gov and other websites. This information is not intended to replace advice given to you by your health care provider. Make sure you discuss any questions you have with your health care provider. Document Released: 05/28/2009 Document Revised: 03/29/2016 Document Reviewed: 12/16/2014 Elsevier Interactive Patient Education  2017 Elsevier Inc.  - High-Protein and High-Calorie Diet Eating high-protein and high-calorie foods can help you to gain weight, heal after an injury, and recover after an illness or surgery. What is my plan? The specific amount of daily protein and calories you need depends on:  Your body weight.  The reason this diet  is recommended for you. Generally, a high-protein, high-calorie diet involves:  Eating 250-500 extra calories each day.  Making sure that 10-35% of your daily calories come from protein. Talk to your health care provider about how much protein and how many calories you need each day. Follow the diet as directed by your health care provider. What do I need to know about this diet?  Ask your health care provider if you should take a nutritional supplement.  Try to eat six small meals each day instead of three large meals.  Eat a balanced diet, including one food that is high in protein at each meal.  Keep nutritious snacks handy, such as nuts, trail mixes, dried fruit, and yogurt.  If you have kidney disease or diabetes, eating too much protein may put extra stress on your kidneys. Talk to your health care provider if you have either of those conditions. What are some high-protein foods? Grains  Quinoa. Bulgur wheat. Vegetables  Soybeans. Peas. Meats and Other Protein Sources  Beef, pork, and poultry. Fish and seafood. Eggs. Tofu. Textured vegetable protein (TVP). Peanut butter. Nuts and seeds. Dried beans. Protein powders. Dairy  Whole milk. Whole-milk yogurt. Powdered milk. Cheese. [employer]. Eggnog. Beverages  High-protein supplement drinks. Soy milk. Other  Protein bars. The items listed above may not be a complete list of recommended foods or beverages. Contact your dietitian for more options.  What are some high-calorie foods? Grains  Pasta. Quick breads. Muffins. Pancakes. Ready-to-eat cereal. Vegetables  Vegetables cooked in oil or butter. Fried potatoes. Fruits  Dried fruit. Fruit leather. Canned fruit in syrup. Fruit juice. Avocados. Meats and Other Protein Sources  Peanut butter. Nuts and seeds. Dairy  Heavy cream. Whipped cream. Cream cheese. Sour cream. Ice cream. Custard. Pudding. Beverages  Meal-replacement beverages. Nutrition shakes. Fruit juice.  Sugar-sweetened soft drinks. Condiments  Salad dressing. Mayonnaise. Alfredo sauce. Fruit preserves or jelly. Honey. Syrup. Sweets/Desserts  Cake. Cookies. Pie. Pastries. Candy bars. Chocolate. Fats and Oils  Butter or margarine. Oil. Gravy. Other  Meal-replacement bars. The items listed above may not be a complete list of recommended foods or beverages. Contact your dietitian for more options.  What are some tips for including high-protein and high-calorie foods in my diet?  Add whole milk, half-and-half, or heavy cream to cereal, pudding, soup, or hot cocoa.  Add whole milk to instant breakfast drinks.  Add peanut butter to oatmeal or smoothies.  Add powdered milk to baked goods, smoothies, or milkshakes.  Add powdered milk, cream, or butter to mashed potatoes.  Add cheese to cooked vegetables.  Make whole-milk yogurt parfaits. Top them with granola, fruit, or nuts.  Add cottage cheese to your fruit.  Add avocados, cheese, or  both to sandwiches or salads.  Add meat, poultry, or seafood to rice, pasta, casseroles, salads, and soups.  Use mayonnaise when making egg salad, chicken salad, or tuna salad.  Use peanut butter as a topping for pretzels, celery, or crackers.  Add beans to casseroles, dips, and spreads.  Add pureed beans to sauces and soups.  Replace calorie-free drinks with calorie-containing drinks, such as milk and fruit juice. This information is not intended to replace advice given to you by your health care provider. Make sure you discuss any questions you have with your health care provider. Document Released: 08/01/2005 Document Revised: 01/07/2016 Document Reviewed: 01/14/2014 Elsevier Interactive Patient Education  2017 [employer].

## 2017-01-03 LAB — CBC WITH DIFFERENTIAL/PLATELET
BASOS: 1 %
Basophils Absolute: 0 10*3/uL (ref 0.0–0.2)
EOS (ABSOLUTE): 0.1 10*3/uL (ref 0.0–0.4)
EOS: 2 %
HEMATOCRIT: 35 % (ref 34.0–46.6)
HEMOGLOBIN: 11.8 g/dL (ref 11.1–15.9)
IMMATURE GRANS (ABS): 0 10*3/uL (ref 0.0–0.1)
IMMATURE GRANULOCYTES: 0 %
LYMPHS: 39 %
Lymphocytes Absolute: 1.7 10*3/uL (ref 0.7–3.1)
MCH: 32.5 pg (ref 26.6–33.0)
MCHC: 33.7 g/dL (ref 31.5–35.7)
MCV: 96 fL (ref 79–97)
Monocytes Absolute: 0.5 10*3/uL (ref 0.1–0.9)
Monocytes: 12 %
NEUTROS ABS: 2.1 10*3/uL (ref 1.4–7.0)
NEUTROS PCT: 46 %
Platelets: 210 10*3/uL (ref 150–379)
RBC: 3.63 x10E6/uL — ABNORMAL LOW (ref 3.77–5.28)
RDW: 16 % — ABNORMAL HIGH (ref 12.3–15.4)
WBC: 4.5 10*3/uL (ref 3.4–10.8)

## 2017-01-03 LAB — BASIC METABOLIC PANEL
BUN / CREAT RATIO: 19 (ref 9–23)
BUN: 17 mg/dL (ref 6–24)
CHLORIDE: 99 mmol/L (ref 96–106)
CO2: 23 mmol/L (ref 18–29)
Calcium: 9.2 mg/dL (ref 8.7–10.2)
Creatinine, Ser: 0.91 mg/dL (ref 0.57–1.00)
GFR calc Af Amer: 82 mL/min/{1.73_m2} (ref >59)
GFR calc non Af Amer: 71 mL/min/{1.73_m2} (ref >59)
GLUCOSE: 72 mg/dL (ref 65–99)
Potassium: 3.9 mmol/L (ref 3.5–5.2)
SODIUM: 141 mmol/L (ref 134–144)

## 2017-01-03 LAB — IRON AND TIBC
IRON SATURATION: 36 % (ref 15–55)
IRON: 112 ug/dL (ref 27–159)
Total Iron Binding Capacity: 308 ug/dL (ref 250–450)
UIBC: 196 ug/dL (ref 131–425)

## 2017-01-03 LAB — FERRITIN: FERRITIN: 105 ng/mL (ref 15–150)

## 2017-01-04 ENCOUNTER — Telehealth: Payer: Self-pay | Admitting: Internal Medicine

## 2017-01-04 ENCOUNTER — Telehealth: Payer: Self-pay

## 2017-01-04 NOTE — Telephone Encounter (Signed)
Pt. Returned call and was given lab results. Pt. Had no questions.

## 2017-01-04 NOTE — Telephone Encounter (Signed)
Contacted pt to go over lab results pt didn't answer lvm asking pt to give me a call at her earliest conveneince   If pt calls back please give results: Blood count better than 7months ago. Iron studies look normal Kidney function looks good. No diabetes. Work on drinking and smoking less to eventually quit!

## 2017-01-13 ENCOUNTER — Ambulatory Visit: Payer: Self-pay

## 2017-06-20 ENCOUNTER — Encounter (HOSPITAL_COMMUNITY): Payer: Self-pay

## 2022-11-25 ENCOUNTER — Emergency Department (HOSPITAL_COMMUNITY): Payer: Medicaid Other

## 2022-11-25 ENCOUNTER — Other Ambulatory Visit: Payer: Self-pay

## 2022-11-25 ENCOUNTER — Emergency Department (HOSPITAL_COMMUNITY)
Admission: EM | Admit: 2022-11-25 | Discharge: 2022-11-25 | Disposition: A | Discharge status: Home / Self Care | Payer: Medicaid Other | Attending: Emergency Medicine | Admitting: Emergency Medicine

## 2022-11-25 ENCOUNTER — Encounter (HOSPITAL_COMMUNITY): Payer: Self-pay

## 2022-11-25 DIAGNOSIS — M545 Low back pain, unspecified: Secondary | ICD-10-CM | POA: Insufficient documentation

## 2022-11-25 DIAGNOSIS — M25512 Pain in left shoulder: Secondary | ICD-10-CM | POA: Diagnosis not present

## 2022-11-25 DIAGNOSIS — F17200 Nicotine dependence, unspecified, uncomplicated: Secondary | ICD-10-CM | POA: Diagnosis not present

## 2022-11-25 DIAGNOSIS — R102 Pelvic and perineal pain: Secondary | ICD-10-CM | POA: Insufficient documentation

## 2022-11-25 DIAGNOSIS — W19XXXA Unspecified fall, initial encounter: Secondary | ICD-10-CM | POA: Diagnosis not present

## 2022-11-25 NOTE — ED Triage Notes (Signed)
Patient had a fall over a week ago has left lower back pain. Tried icey hot but did not help with the pain. Ambulating without difficulty.

## 2022-11-25 NOTE — ED Provider Notes (Signed)
[place] EMERGENCY DEPARTMENT AT [facility] Provider Note   CSN: [id number] Arrival date & time: 11/25/22  1654     History {Add pertinent medical, surgical, social history, OB history to HPI:1} Chief Complaint  Patient presents with   Back Pain    Gabrielle Davis is a 62 y.o. female with past medical history significant for ETOH abuse, tobacco abuse presents to the ED complaining of left lower back pain into her pelvis and left shoulder pain.  Patient had a mechanical fall about a week and a half ago and is still having some pain.  She has tried [employer] but has not had relief.  Denies head trauma, loss of consciousness, numbness or weakness, difficulty ambulating, dizziness, lightheadedness, loss of bladder or bowel control.         Home Medications Prior to Admission medications   Medication Sig Start Date End Date Taking? Authorizing Provider  carbamide peroxide (DEBROX) 6.5 % otic solution Place 5 drops into the left ear 2 (two) times daily. Patient not taking: Reported on 01/02/2017 05/10/16   Pete Glatter, MD  hydrOXYzine (ATARAX/VISTARIL) 25 MG tablet Take 1 tablet (25 mg total) by mouth every 6 (six) hours as needed for itching. Patient not taking: Reported on 05/10/2016 08/07/14   Charm Rings, MD  meloxicam (MOBIC) 7.5 MG tablet Take 1 tablet (7.5 mg total) by mouth daily. Take 1-2 pills daily for 7 days, then daily as needed for pain. Patient not taking: Reported on 05/10/2016 05/28/14   Lurene Shadow, PA-C  methocarbamol (ROBAXIN) 500 MG tablet Take 1 tablet (500 mg total) by mouth 2 (two) times daily. Patient not taking: Reported on 05/10/2016 05/28/14   Lurene Shadow, PA-C  metroNIDAZOLE (FLAGYL) 500 MG tablet Take 1 tablet (500 mg total) by mouth 2 (two) times daily. Patient not taking: Reported on 01/02/2017 06/04/16   Pete Glatter, MD      Allergies    Codeine    Review of Systems   Review of Systems  Musculoskeletal:  Positive for back  pain.    Physical Exam Updated Vital Signs BP (!) 161/89 (BP Location: Right Arm)   Pulse 73   Temp 98.5 F (36.9 C) (Oral)   Resp 18   Ht 5\' 5"  (1.651 m)   Wt 40.8 kg   SpO2 100%   BMI 14.98 kg/m  Physical Exam Vitals and nursing note reviewed.  Constitutional:      General: She is not in acute distress.    Appearance: Normal appearance. She is not ill-appearing or diaphoretic.  Cardiovascular:     Rate and Rhythm: Normal rate and regular rhythm.  Pulmonary:     Effort: Pulmonary effort is normal.  Musculoskeletal:     Left shoulder: Tenderness present. No swelling, deformity, bony tenderness or crepitus. Normal range of motion. Normal strength. Normal pulse.     Cervical back: Full passive range of motion without pain. No tenderness or bony tenderness. No pain with movement, spinous process tenderness or muscular tenderness. Normal range of motion.     Thoracic back: No spasms, tenderness or bony tenderness. Normal range of motion.     Lumbar back: Tenderness (left lumbar region) present. No swelling, deformity, signs of trauma, spasms or bony tenderness. Normal range of motion.     Comments: Mild tenderness to palpation of left lumbar paraspinal region with tenderness extending into left pelvis.  No obvious deformities or gross abnormalities.    Neurological:  General: No focal deficit present.     Mental Status: She is alert and oriented to person, place, and time. Mental status is at baseline.     Cranial Nerves: Cranial nerves 2-12 are intact.     Sensory: Sensation is intact.     Motor: Motor function is intact.     Coordination: Coordination is intact.     Gait: Gait is intact.  Psychiatric:        Mood and Affect: Mood normal.        Behavior: Behavior normal.     ED Results / Procedures / Treatments   Labs (all labs ordered are listed, but only abnormal results are displayed) Labs Reviewed - No data to display  EKG None  Radiology DG Pelvis 1-2  Views  Result Date: 11/25/2022 CLINICAL DATA:  Fall with injury, left lower back pain. EXAM: PELVIS - 1-2 VIEW; LUMBAR SPINE - COMPLETE 4+ VIEW COMPARISON:  11/26/2011. FINDINGS: Pelvis: There is no evidence of pelvic fracture or diastasis. No pelvic bone lesions are seen. Lumbar spine: No acute fracture in the lumbar spine. There is mild anterolisthesis at L4-L5 and mild retrolisthesis at L5-S1. Intervertebral disc space narrowing is noted at L5-S1. Degenerative endplate changes and facet arthropathy are present at L4-L5 and L5-S1. IMPRESSION: 1. No acute fracture. 2. Degenerative changes in the lumbar spine. Electronically Signed   By: Thornell Sartorius M.D.   On: 11/25/2022 21:02   DG Lumbar Spine Complete  Result Date: 11/25/2022 CLINICAL DATA:  Fall with injury, left lower back pain. EXAM: PELVIS - 1-2 VIEW; LUMBAR SPINE - COMPLETE 4+ VIEW COMPARISON:  11/26/2011. FINDINGS: Pelvis: There is no evidence of pelvic fracture or diastasis. No pelvic bone lesions are seen. Lumbar spine: No acute fracture in the lumbar spine. There is mild anterolisthesis at L4-L5 and mild retrolisthesis at L5-S1. Intervertebral disc space narrowing is noted at L5-S1. Degenerative endplate changes and facet arthropathy are present at L4-L5 and L5-S1. IMPRESSION: 1. No acute fracture. 2. Degenerative changes in the lumbar spine. Electronically Signed   By: Thornell Sartorius M.D.   On: 11/25/2022 21:02   DG Shoulder Left  Result Date: 11/25/2022 CLINICAL DATA:  Fall with injury. EXAM: LEFT SHOULDER - 2+ VIEW COMPARISON:  None Available. FINDINGS: There is no evidence of acute fracture or dislocation. Old healed rib fractures are noted on the left. A lucency with slightly thickened rim of sclerosis is noted in the glenoid joint measuring 7 mm. There is no evidence of arthropathy or other focal bone abnormality. Soft tissues are unremarkable. IMPRESSION: 1. No acute fracture or dislocation. 2. Lucency with surrounding sclerosis at the  glenoid with indeterminate imaging characteristics. Follow-up short-term radiograph or MRI is recommended. Electronically Signed   By: Thornell Sartorius M.D.   On: 11/25/2022 21:00    Procedures Procedures  {Document cardiac monitor, telemetry assessment procedure when appropriate:1}  Medications Ordered in ED Medications - No data to display  ED Course/ Medical Decision Making/ A&P   {   Click here for ABCD2, HEART and other calculatorsREFRESH Note before signing :1}                          Medical Decision Making Amount and/or Complexity of Data Reviewed Radiology: ordered.   ***  {Document critical care time when appropriate:1} {Document review of labs and clinical decision tools ie heart score, Chads2Vasc2 etc:1}  {Document your independent review of radiology images, and any outside records:1} {  Document your discussion with family members, caretakers, and with consultants:1} {Document social determinants of health affecting pt's care:1} {Document your decision making why or why not admission, treatments were needed:1} Final Clinical Impression(s) / ED Diagnoses Final diagnoses:  None    Rx / DC Orders ED Discharge Orders     None

## 2022-11-25 NOTE — Discharge Instructions (Addendum)
Thank you for allowing me to be a part of your care today.   Your x-rays did not show any broken bones or dislocations.  Please follow up with your primary care provider next week.    I recommend taking Tylenol as needed for pain.  You may also use ice or heat on painful areas.   Return to the ED if you experience sudden worsening of your symptoms or if you have any new concerns.

## 2023-01-27 ENCOUNTER — Other Ambulatory Visit: Payer: Self-pay | Admitting: Family Medicine

## 2023-01-27 DIAGNOSIS — Z1231 Encounter for screening mammogram for malignant neoplasm of breast: Secondary | ICD-10-CM

## 2023-02-15 ENCOUNTER — Ambulatory Visit
Admission: RE | Admit: 2023-02-15 | Discharge: 2023-02-15 | Disposition: A | Discharge status: Home / Self Care | Payer: Medicaid Other | Source: Ambulatory Visit | Attending: Family Medicine | Admitting: Family Medicine

## 2023-02-15 DIAGNOSIS — Z1231 Encounter for screening mammogram for malignant neoplasm of breast: Secondary | ICD-10-CM

## 2023-03-06 ENCOUNTER — Ambulatory Visit (INDEPENDENT_AMBULATORY_CARE_PROVIDER_SITE_OTHER): Payer: Medicaid Other | Admitting: Physician Assistant

## 2023-03-06 DIAGNOSIS — M545 Low back pain, unspecified: Secondary | ICD-10-CM | POA: Diagnosis not present

## 2023-03-06 DIAGNOSIS — G8929 Other chronic pain: Secondary | ICD-10-CM

## 2023-03-06 DIAGNOSIS — M25512 Pain in left shoulder: Secondary | ICD-10-CM

## 2023-03-06 NOTE — Progress Notes (Signed)
Office Visit Note   Patient: Gabrielle Davis           Date of Birth: 1960/11/11           MRN: [id number] Visit Date: 03/06/2023              Requested by: Dot Been, FNP [address] [place],  [place] [address] PCP: Dot Been, FNP   Assessment & Plan: Visit Diagnoses:  1. Chronic left shoulder pain   2. Chronic left-sided low back pain without sciatica     Plan: Impression is chronic left-sided low back pain without sciatica and intermittent left shoulder pain.  In regards to the lower back, do not want to start her on any anti-inflammatories or steroids due to previous history of perforated gastric ulcer.  I recommended topical Voltaren and provided her with a slip.  I have also provided her with a spine exercise program.  In regards to her shoulder, she is currently asymptomatic but because of recommendation from the radiologist on previous x-rays we will order an MRI to further assess the lucency in the glenoid.  We will call her with the results.  Call us with concerns or questions in the meantime.  Follow-Up Instructions: Return if symptoms worsen or fail to improve.   Orders:  No orders of the defined types were placed in this encounter.  No orders of the defined types were placed in this encounter.     Procedures: No procedures performed   Clinical Data: No additional findings.   Subjective: Chief Complaint  Patient presents with   Left Shoulder - Pain   Lower Back - Pain    HPI patient is a pleasant 62 year old female who comes in today with left shoulder pain and low back pain.  Regards to her left shoulder, she has had chronic pain after working in [employer] service for years.  This pain increased after sustaining mechanical fall landing on her left side about a year ago.  The pain she has is to the proximal humerus.  There are no specific aggravators.  No weakness or paresthesias of the left upper extremity.  She occasionally takes medicine for  this.  Currently not hurting today.  No previous cortisone injection to the left shoulder.  She was seen back in April where x-rays were obtained which noted a lucency to the glenoid.  Further imaging with MRI was recommended.  In regards to the back, she has had pain to the left lower back since sustaining the same mechanical fall about a year ago.  X-rays obtained showed degenerative changes L5-S1 as well as facet arthropathy L4-5 and L5-S1.  She has intermittent pain to the left lower back without radiation or weakness to the left lower extremity.  No paresthesias.  No specific aggravators.  Of note, she has a history of gastric ulcer with perforation.  Review of Systems as detailed in HPI.  All others reviewed and are negative.   Objective: Vital Signs: There were no vitals taken for this visit.  Physical Exam well-developed well-nourished female in no acute distress.  Alert and oriented x 3.  Ortho Exam left shoulder exam reveals full active range of motion in all planes.  Full strength throughout.  Negative empty can, cross body adduction, speeds, O'Brien's and bearhug.  She is neurovascularly intact distally.  Lumbar spine exam shows no spinous tenderness.  She does have slight tenderness to the left-sided paraspinous musculature.  No pain with lumbar flexion,  extension or rotation.  No focal weakness.  Negative straight leg raise.  She is neurovascular intact distally.  Specialty Comments:  No specialty comments available.  Imaging: No new imaging   PMFS History: Patient Active Problem List   Diagnosis Date Noted   ETOH abuse 05/10/2016   Moderate protein-calorie malnutrition (HCC) 05/10/2016   Smoking addiction 05/10/2016   Gastric ulcer with perforation (HCC) 12/14/2011   Ileus following gastrointestinal surgery (HCC) 12/07/2011   Illicit drug use 12/07/2011   Past Medical History:  Diagnosis Date   ETOH abuse     Family History  Problem Relation Age of Onset    Hypertension Sister    Cancer Paternal Aunt        breast   Cancer Paternal Aunt        breast   Cancer Paternal Aunt        breast   Cancer Paternal Aunt        breast   Colon cancer Neg Hx    Esophageal cancer Neg Hx    Rectal cancer Neg Hx    Stomach cancer Neg Hx     Past Surgical History:  Procedure Laterality Date   BREAST LUMPECTOMY     left 20 years ago   LAPAROTOMY  11/26/2011   Procedure: EXPLORATORY LAPAROTOMY;  Surgeon: Adolph Pollack, MD;  Location: [facility];  Service: General;  Laterality: N/A;   Social History   Occupational History   Not on file  Tobacco Use   Smoking status: Every Day    Current packs/day: 0.50    Average packs/day: 0.5 packs/day for 30.0 years (15.0 ttl pk-yrs)    Types: Cigarettes   Smokeless tobacco: Never  Substance and Sexual Activity   Alcohol use: Yes    Alcohol/week: 12.0 standard drinks of alcohol    Types: 12 Cans of beer per week   Drug use: No   Sexual activity: Yes    Birth control/protection: Post-menopausal

## 2023-03-06 NOTE — Addendum Note (Signed)
Addended by: Wendi Maya on: 03/06/2023 11:06 AM   Modules accepted: Orders

## 2023-04-11 ENCOUNTER — Other Ambulatory Visit: Payer: Self-pay | Admitting: Family Medicine

## 2023-04-11 DIAGNOSIS — Z13820 Encounter for screening for osteoporosis: Secondary | ICD-10-CM

## 2023-04-11 DIAGNOSIS — Z720 Tobacco use: Secondary | ICD-10-CM

## 2023-04-20 ENCOUNTER — Encounter: Payer: Self-pay | Admitting: Family Medicine

## 2023-04-27 ENCOUNTER — Ambulatory Visit
Admission: RE | Admit: 2023-04-27 | Discharge: 2023-04-27 | Disposition: A | Discharge status: Home / Self Care | Payer: Medicaid Other | Source: Ambulatory Visit | Attending: Family Medicine | Admitting: Family Medicine

## 2023-04-27 DIAGNOSIS — Z720 Tobacco use: Secondary | ICD-10-CM

## 2023-10-18 ENCOUNTER — Other Ambulatory Visit: Payer: Medicaid Other

## 2023-12-26 ENCOUNTER — Other Ambulatory Visit: Payer: Self-pay | Admitting: Family Medicine

## 2023-12-26 DIAGNOSIS — F17219 Nicotine dependence, cigarettes, with unspecified nicotine-induced disorders: Secondary | ICD-10-CM

## 2023-12-26 DIAGNOSIS — Z122 Encounter for screening for malignant neoplasm of respiratory organs: Secondary | ICD-10-CM

## 2024-01-09 ENCOUNTER — Other Ambulatory Visit: Payer: Self-pay | Admitting: Family Medicine

## 2024-01-09 DIAGNOSIS — Z1231 Encounter for screening mammogram for malignant neoplasm of breast: Secondary | ICD-10-CM

## 2024-02-07 LAB — COLOGUARD: COLOGUARD: NEGATIVE

## 2024-02-19 ENCOUNTER — Ambulatory Visit

## 2024-04-04 ENCOUNTER — Encounter (HOSPITAL_COMMUNITY): Payer: Self-pay | Admitting: Emergency Medicine

## 2024-04-04 ENCOUNTER — Ambulatory Visit (HOSPITAL_COMMUNITY)
Admission: EM | Admit: 2024-04-04 | Discharge: 2024-04-04 | Disposition: A | Discharge status: Home / Self Care | Attending: Family Medicine | Admitting: Family Medicine

## 2024-04-04 DIAGNOSIS — M542 Cervicalgia: Secondary | ICD-10-CM

## 2024-04-04 MED ORDER — KETOROLAC TROMETHAMINE 30 MG/ML IJ SOLN
INTRAMUSCULAR | Status: AC
  Filled 2024-04-04: qty 1

## 2024-04-04 MED ORDER — KETOROLAC TROMETHAMINE 30 MG/ML IJ SOLN
30.0000 mg | Freq: Once | INTRAMUSCULAR | Status: AC
  Administered 2024-04-04: 30 mg via INTRAMUSCULAR

## 2024-04-04 MED ORDER — KETOROLAC TROMETHAMINE 10 MG PO TABS: 10.0000 mg | ORAL_TABLET | Freq: Four times a day (QID) | ORAL | 0 refills | Status: DC | PRN

## 2024-04-04 MED ORDER — TIZANIDINE HCL 4 MG PO TABS: 4.0000 mg | ORAL_TABLET | Freq: Every evening | ORAL | 0 refills | Status: DC | PRN

## 2024-04-04 NOTE — Discharge Instructions (Addendum)
 You have been given a shot of Toradol  30 mg today.  Ketorolac  10 mg tablets--take 1 tablet every 6 hours as needed for pain.  This is the same medicine that is in the shot we just gave you   Take tizanidine  4 mg--1 at bedtime as needed for muscle spasms; this medication can cause dizziness and sleepiness  Please follow-up with your primary care about this issue

## 2024-04-04 NOTE — ED Provider Notes (Signed)
 [facility]    CSN: [id number] Arrival date & time: 04/04/24  1525      History   Chief Complaint Chief Complaint  Patient presents with   Back Pain   Neck Pain    HPI Gabrielle Davis is a 63 y.o. female.    Back Pain Neck Pain Here for pain in her neck and bilateral shoulders and up into her posterior head.  It began about 2 weeks ago.  She was taken some ibuprofen  and it did help a little bit but not a lot.  No fall or trauma that is recent.  She is allergic to codeine which makes her itch.  She is postmenopausal  Last EGFR was 97 in June of this year.  Past Medical History:  Diagnosis Date   ETOH abuse     Patient Active Problem List   Diagnosis Date Noted   ETOH abuse 05/10/2016   Moderate protein-calorie malnutrition (HCC) 05/10/2016   Smoking addiction 05/10/2016   Gastric ulcer with perforation (HCC) 12/14/2011   Ileus following gastrointestinal surgery (HCC) 12/07/2011   Illicit drug use 12/07/2011    Past Surgical History:  Procedure Laterality Date   BREAST LUMPECTOMY     left 20 years ago   LAPAROTOMY  11/26/2011   Procedure: EXPLORATORY LAPAROTOMY;  Surgeon: Krystal JINNY Russell, MD;  Location: [facility];  Service: General;  Laterality: N/A;    OB History     Gravida  0   Para      Term      Preterm      AB      Living         SAB      IAB      Ectopic      Multiple      Live Births               Home Medications    Prior to Admission medications   Medication Sig Start Date End Date Taking? Authorizing Provider  ketorolac  (TORADOL ) 10 MG tablet Take 1 tablet (10 mg total) by mouth every 6 (six) hours as needed (pain). 04/04/24  Yes Vonna Sharlet POUR, MD  tiZANidine  (ZANAFLEX ) 4 MG tablet Take 1 tablet (4 mg total) by mouth at bedtime as needed for muscle spasms. 04/04/24  Yes Vonna Sharlet POUR, MD    Family History Family History  Problem Relation Age of Onset   Hypertension Sister    Cancer Paternal  Aunt        breast   Cancer Paternal Aunt        breast   Cancer Paternal Aunt        breast   Cancer Paternal Aunt        breast   Colon cancer Neg Hx    Esophageal cancer Neg Hx    Rectal cancer Neg Hx    Stomach cancer Neg Hx     Social History Social History   Tobacco Use   Smoking status: Every Day    Current packs/day: 0.50    Average packs/day: 0.5 packs/day for 30.0 years (15.0 ttl pk-yrs)    Types: Cigarettes   Smokeless tobacco: Never  Substance Use Topics   Alcohol use: Yes    Alcohol/week: 12.0 standard drinks of alcohol    Types: 12 Cans of beer per week   Drug use: No     Allergies   Codeine   Review of Systems Review of Systems  Musculoskeletal:  Positive for back pain and neck pain.     Physical Exam Triage Vital Signs ED Triage Vitals  Encounter Vitals Group     BP 04/04/24 1632 (!) 180/93     Girls Systolic BP Percentile --      Girls Diastolic BP Percentile --      Boys Systolic BP Percentile --      Boys Diastolic BP Percentile --      Pulse Rate 04/04/24 1632 63     Resp 04/04/24 1632 16     Temp 04/04/24 1632 98.2 F (36.8 C)     Temp Source 04/04/24 1632 Oral     SpO2 04/04/24 1632 97 %     Weight --      Height --      Head Circumference --      Peak Flow --      Pain Score 04/04/24 1631 10     Pain Loc --      Pain Education --      Exclude from Growth Chart --    No data found.  Updated Vital Signs BP (!) 180/93 (BP Location: Right Arm)   Pulse 63   Temp 98.2 F (36.8 C) (Oral)   Resp 16   SpO2 97%   Visual Acuity Right Eye Distance:   Left Eye Distance:   Bilateral Distance:    Right Eye Near:   Left Eye Near:    Bilateral Near:     Physical Exam Vitals reviewed.  Constitutional:      General: She is not in acute distress.    Appearance: She is not ill-appearing, toxic-appearing or diaphoretic.  HENT:     Mouth/Throat:     Mouth: Mucous membranes are moist.  Eyes:     Extraocular Movements:  Extraocular movements intact.     Conjunctiva/sclera: Conjunctivae normal.     Pupils: Pupils are equal, round, and reactive to light.  Neck:     Comments: There is some tenderness of the trapezius bilaterally at the top of both shoulders and onto the trapezius of the posterior neck.  There is no rash or deformity. Cardiovascular:     Rate and Rhythm: Normal rate and regular rhythm.     Heart sounds: No murmur heard. Pulmonary:     Effort: Pulmonary effort is normal.     Breath sounds: Normal breath sounds.  Musculoskeletal:     Cervical back: Neck supple. No rigidity.  Lymphadenopathy:     Cervical: No cervical adenopathy.  Skin:    Coloration: Skin is not pale.  Neurological:     Mental Status: She is alert and oriented to person, place, and time.  Psychiatric:        Behavior: Behavior normal.      UC Treatments / Results  Labs (all labs ordered are listed, but only abnormal results are displayed) Labs Reviewed - No data to display  EKG   Radiology No results found.  Procedures Procedures (including critical care time)  Medications Ordered in UC Medications  ketorolac  (TORADOL ) 30 MG/ML injection 30 mg (has no administration in time range)    Initial Impression / Assessment and Plan / UC Course  I have reviewed the triage vital signs and the nursing notes.  Pertinent labs & imaging results that were available during my care of the patient were reviewed by me and considered in my medical decision making (see chart for details).     Most likely her elevated blood pressure is  due to the pain.  She had normal vitals at her last visit with her PCP in June.  Toradol  injection is given here and Toradol  tablets are sent to the pharmacy along with some tizanidine  for nighttime use.  She is off work the next 3 days and will be able to rest. I have asked her to follow-up with her primary care Final Clinical Impressions(s) / UC Diagnoses   Final diagnoses:  Neck  pain     Discharge Instructions      You have been given a shot of Toradol  30 mg today.  Ketorolac  10 mg tablets--take 1 tablet every 6 hours as needed for pain.  This is the same medicine that is in the shot we just gave you   Take tizanidine  4 mg--1 at bedtime as needed for muscle spasms; this medication can cause dizziness and sleepiness  Please follow-up with your primary care about this issue      ED Prescriptions     Medication Sig Dispense Auth. Provider   ketorolac  (TORADOL ) 10 MG tablet Take 1 tablet (10 mg total) by mouth every 6 (six) hours as needed (pain). 20 tablet Vonna Sharlet POUR, MD   tiZANidine  (ZANAFLEX ) 4 MG tablet Take 1 tablet (4 mg total) by mouth at bedtime as needed for muscle spasms. 7 tablet Ayliana Casciano K, MD      PDMP not reviewed this encounter.   Vonna Sharlet POUR, MD 04/04/24 [phone]

## 2024-04-04 NOTE — ED Triage Notes (Signed)
 Pt c/o bilateral upper back shoulder pain that radiates to her neck for a couple weeks. Denies injury or fall but reports does do lifting at her job. Tried ibuprofen , arthritis and creams but not helping.

## 2024-04-05 ENCOUNTER — Ambulatory Visit (HOSPITAL_COMMUNITY)

## 2024-04-12 ENCOUNTER — Ambulatory Visit
Admission: RE | Admit: 2024-04-12 | Discharge: 2024-04-12 | Disposition: A | Discharge status: Home / Self Care | Source: Ambulatory Visit | Attending: Family Medicine | Admitting: Family Medicine

## 2024-04-12 DIAGNOSIS — Z1231 Encounter for screening mammogram for malignant neoplasm of breast: Secondary | ICD-10-CM

## 2024-06-18 ENCOUNTER — Emergency Department (HOSPITAL_COMMUNITY)
Admission: EM | Admit: 2024-06-18 | Discharge: 2024-06-18 | Disposition: A | Discharge status: Home / Self Care | Attending: Emergency Medicine | Admitting: Emergency Medicine

## 2024-06-18 ENCOUNTER — Other Ambulatory Visit: Payer: Self-pay

## 2024-06-18 ENCOUNTER — Emergency Department (HOSPITAL_COMMUNITY)

## 2024-06-18 ENCOUNTER — Encounter (HOSPITAL_COMMUNITY): Payer: Self-pay

## 2024-06-18 DIAGNOSIS — M542 Cervicalgia: Secondary | ICD-10-CM | POA: Diagnosis present

## 2024-06-18 DIAGNOSIS — F17210 Nicotine dependence, cigarettes, uncomplicated: Secondary | ICD-10-CM | POA: Insufficient documentation

## 2024-06-18 DIAGNOSIS — W19XXXA Unspecified fall, initial encounter: Secondary | ICD-10-CM

## 2024-06-18 DIAGNOSIS — W010XXA Fall on same level from slipping, tripping and stumbling without subsequent striking against object, initial encounter: Secondary | ICD-10-CM | POA: Insufficient documentation

## 2024-06-18 DIAGNOSIS — M62838 Other muscle spasm: Secondary | ICD-10-CM | POA: Diagnosis not present

## 2024-06-18 DIAGNOSIS — M546 Pain in thoracic spine: Secondary | ICD-10-CM | POA: Diagnosis not present

## 2024-06-18 MED ORDER — ACETAMINOPHEN 325 MG PO TABS: 650.0000 mg | ORAL_TABLET | Freq: Four times a day (QID) | ORAL | 0 refills | Status: AC | PRN

## 2024-06-18 MED ORDER — LIDOCAINE 5 % EX PTCH
1.0000 | MEDICATED_PATCH | Freq: Once | CUTANEOUS | Status: DC
  Administered 2024-06-18: 1 via TRANSDERMAL
  Filled 2024-06-18: qty 1

## 2024-06-18 MED ORDER — CYCLOBENZAPRINE HCL 10 MG PO TABS: 10.0000 mg | ORAL_TABLET | Freq: Two times a day (BID) | ORAL | 0 refills | Status: DC | PRN

## 2024-06-18 MED ORDER — IBUPROFEN 600 MG PO TABS: 600.0000 mg | ORAL_TABLET | Freq: Four times a day (QID) | ORAL | 0 refills | Status: DC | PRN

## 2024-06-18 MED ORDER — OXYCODONE HCL 5 MG PO TABS: 5.0000 mg | ORAL_TABLET | Freq: Four times a day (QID) | ORAL | 0 refills | Status: AC | PRN

## 2024-06-18 MED ORDER — ACETAMINOPHEN 500 MG PO TABS
1000.0000 mg | ORAL_TABLET | Freq: Once | ORAL | Status: AC
  Administered 2024-06-18: 1000 mg via ORAL
  Filled 2024-06-18 (×2): qty 2

## 2024-06-18 MED ORDER — KETOROLAC TROMETHAMINE 15 MG/ML IJ SOLN
15.0000 mg | Freq: Once | INTRAMUSCULAR | Status: AC
  Administered 2024-06-18: 15 mg via INTRAMUSCULAR
  Filled 2024-06-18: qty 1

## 2024-06-18 NOTE — ED Provider Notes (Signed)
  EMERGENCY DEPARTMENT AT [facility] Provider Note  CSN: [id number] Arrival date & time: 06/18/24 9081  Chief Complaint(s) Fall  HPI SHAUGHNESSY GETHERS is a 63 y.o. female with past medical history as below, significant for alcohol abuse, malnutrition, gastric ulcer who presents to the ED with complaint of upper back pain, fall  Reports she tripped and fell around 1 week ago, slipped on object outside her home.  Injured her upper back at that time.  No head injury or LOC.  She was ambulatory after the event.  No numbness or weakness to her extremities, no headache or head injury.  No vision or hearing changes.  No chest pain, abdominal pain, nausea or vomiting.  No change in bowel or bladder function  Past Medical History Past Medical History:  Diagnosis Date   ETOH abuse    Patient Active Problem List   Diagnosis Date Noted   ETOH abuse 05/10/2016   Moderate protein-calorie malnutrition 05/10/2016   Smoking addiction 05/10/2016   Gastric ulcer with perforation (HCC) 12/14/2011   Ileus following gastrointestinal surgery (HCC) 12/07/2011   Illicit drug use 12/07/2011   Home Medication(s) Prior to Admission medications   Medication Sig Start Date End Date Taking? Authorizing Provider  methocarbamol  (ROBAXIN ) 500 MG tablet Take 500 mg by mouth 3 (three) times daily as needed. 05/09/24  Yes [provider]  ketorolac  (TORADOL ) 10 MG tablet Take 1 tablet (10 mg total) by mouth every 6 (six) hours as needed (pain). 04/04/24   Vonna Sharlet POUR, MD  tiZANidine  (ZANAFLEX ) 4 MG tablet Take 1 tablet (4 mg total) by mouth at bedtime as needed for muscle spasms. 04/04/24   Vonna Sharlet POUR, MD                                                                                                                                    Past Surgical History Past Surgical History:  Procedure Laterality Date   BREAST LUMPECTOMY     left 20 years ago   LAPAROTOMY  11/26/2011    Procedure: EXPLORATORY LAPAROTOMY;  Surgeon: Krystal JINNY Russell, MD;  Location: [facility];  Service: General;  Laterality: N/A;   Family History Family History  Problem Relation Age of Onset   Hypertension Sister    Cancer Paternal Aunt        breast   Cancer Paternal Aunt        breast   Cancer Paternal Aunt        breast   Cancer Paternal Aunt        breast   Colon cancer Neg Hx    Esophageal cancer Neg Hx    Rectal cancer Neg Hx    Stomach cancer Neg Hx     Social History Social History   Tobacco Use   Smoking status: Every Day    Current packs/day: 0.50    Average packs/day: 0.5 packs/day  for 30.0 years (15.0 ttl pk-yrs)    Types: Cigarettes   Smokeless tobacco: Never  Vaping Use   Vaping status: Never Used  Substance Use Topics   Alcohol use: Yes    Alcohol/week: 12.0 standard drinks of alcohol    Types: 12 Cans of beer per week   Drug use: No   Allergies Codeine  Review of Systems A thorough review of systems was obtained and all systems are negative except as noted in the HPI and PMH.   Physical Exam Vital Signs  I have reviewed the triage vital signs BP 136/83 (BP Location: Left Arm)   Pulse 69   Temp (!) 97.5 F (36.4 C) (Oral)   Resp 18   Ht 5' 5 (1.651 m)   Wt 45.4 kg   SpO2 100%   BMI 16.64 kg/m  Physical Exam Vitals and nursing note reviewed.  Constitutional:      General: She is not in acute distress.    Appearance: Normal appearance. She is well-developed. She is not ill-appearing.  HENT:     Head: Normocephalic and atraumatic.     Right Ear: External ear normal.     Left Ear: External ear normal.     Nose: Nose normal.     Mouth/Throat:     Mouth: Mucous membranes are moist.  Eyes:     General: No scleral icterus.       Right eye: No discharge.        Left eye: No discharge.  Neck:     Trachea: Trachea and phonation normal.  Cardiovascular:     Rate and Rhythm: Normal rate.  Pulmonary:     Effort: Pulmonary effort is normal. No  respiratory distress.     Breath sounds: No stridor.  Abdominal:     General: Abdomen is flat. There is no distension.     Tenderness: There is no guarding.  Musculoskeletal:        General: No deformity.       Arms:     Cervical back: Full passive range of motion without pain and normal range of motion. No rigidity. Muscular tenderness present. No spinous process tenderness.     Comments: Muscle spasm noted trapezius     Skin:    General: Skin is warm and dry.     Coloration: Skin is not cyanotic, jaundiced or pale.  Neurological:     Mental Status: She is alert and oriented to person, place, and time.     GCS: GCS eye subscore is 4. GCS verbal subscore is 5. GCS motor subscore is 6.     Cranial Nerves: No dysarthria.     Sensory: Sensation is intact. No sensory deficit.     Motor: Motor function is intact. No weakness or tremor.     Coordination: Coordination is intact. Coordination normal.     Comments: Strength 5/5 to BLUE/BLLE, equal and symmetric    Psychiatric:        Speech: Speech normal.        Behavior: Behavior normal. Behavior is cooperative.     ED Results and Treatments Labs (all labs ordered are listed, but only abnormal results are displayed) Labs Reviewed - No data to display  Radiology DG Thoracic Spine 2 View Result Date: 06/18/2024 EXAM: 2 VIEW(S) XRAY OF THE THORACIC SPINE 06/18/2024 11:28:44 AM COMPARISON: Compared with lumbar spine dated 11/25/2022. CLINICAL HISTORY: upper back pain, fall 1 wk ago FINDINGS: BONES: Mild thoracic scoliosis convex towards the right. No anterior subluxations. No vertebral compression deformities. No acute fracture. No aggressive appearing osseous lesion. DISCS AND DEGENERATIVE CHANGES: Degenerative changes with narrowed interspaces and mild endplate osteophyte formation throughout. SOFT TISSUES: The visualized  lungs are clear. No paraspinal soft tissue swelling. IMPRESSION: 1. No acute abnormality of the thoracic spine related to the reported fall. 2. Mild thoracic scoliosis convex towards the right. Electronically signed by: Elsie Gravely MD 06/18/2024 12:36 PM EST RP Workstation: [id number]   DG Cervical Spine Complete Result Date: 06/18/2024 EXAM: 6 OR MORE VIEW(S) XRAY OF THE CERVICAL SPINE 06/18/2024 11:28:44 AM COMPARISON: None available. CLINICAL HISTORY: upper back pain, fall 1 wk ago FINDINGS: BONES: Slight anterior subluxation of C3 on C4. This could be degenerative or in the acute setting may indicate ligamentous injury. Correlate with physical examination. Straightening of usual cervical lordosis. This may indicate muscle spasm. Mild anterior compression of mid cervical vertebra appears to be chronic and likely degenerative. No aggressive appearing osseous lesion. DISCS AND DEGENERATIVE CHANGES: Degenerative changes throughout the cervical spine with narrowed interspaces and endplate osteophyte formation. Degenerative changes in the facet joints. SOFT TISSUES: No prevertebral soft tissue swelling. The visualized lungs appear clear. IMPRESSION: 1. Slight anterior subluxation of C3 on C4; degenerative etiology favored, though ligamentous injury cannot be excluded in the acute setting. 2. Straightening of the cervical lordosis, which may reflect muscle spasm. 3. Chronic anterior compression deformity of a mid cervical vertebral body, likely degenerative. Electronically signed by: Elsie Gravely MD 06/18/2024 12:34 PM EST RP Workstation: [id number]    Pertinent labs & imaging results that were available during my care of the patient were reviewed by me and considered in my medical decision making (see MDM for details).  Medications Ordered in ED Medications  lidocaine  (LIDODERM ) 5 % 1 patch (1 patch Transdermal Patch Applied 06/18/24 1133)  ketorolac  (TORADOL ) 15 MG/ML injection 15 mg (15 mg  Intramuscular Given 06/18/24 1139)  acetaminophen  (TYLENOL ) tablet 1,000 mg (1,000 mg Oral Given 06/18/24 1158)                                                                                                                                     Procedures Procedures  (including critical care time)  Medical Decision Making / ED Course    Medical Decision Making:    HARLEYQUINN GASSER is a 63 y.o. female  with past medical history as below, significant for alcohol abuse, malnutrition, gastric ulcer who presents to the ED with complaint of upper back pain, fall. The complaint involves an extensive differential diagnosis and also carries with it a high risk of complications and morbidity.  Serious etiology was considered. Ddx includes  but is not limited to: Fracture, dislocation, muscle spasm, MSK injury, etc.  Complete initial physical exam performed, notably the patient was in no acute distress, sitting comfortably in chair.    Reviewed and confirmed nursing documentation for past medical history, family history, social history.  Vital signs reviewed.    Fall Upper back/neck pain> - canadian c - spine negative - XR was reviewed, these are stable. Likely degenerative changes.  - She is neurologically intact, full ROM to her neck, no paresthesia/anesthesia to UE w/ neck movement. She is ambulatory with steady gait.  Pain improved.  She is tolerating p.o. without difficulty. - Patient with likely contusion from fall.  Provide analgesia for home appear encourage close follow-up with her PCP.  1:20 PM:  I have discussed the diagnosis/risks/treatment options with the patient.  Evaluation and diagnostic testing in the emergency department does not suggest an emergent condition requiring admission or immediate intervention beyond what has been performed at this time.  They will follow up with pcp. We also discussed returning to the ED immediately if new or worsening sx occur. We discussed the sx which  are most concerning (e.g., sudden worsening pain, fever, inability to tolerate by mouth) that necessitate immediate return.    The patient appears reasonably screened and/or stabilized for discharge and I doubt any other medical condition or other [facility] requiring further screening, evaluation, or treatment in the ED at this time prior to discharge.                        Additional history obtained: -Additional history obtained from na -External records from outside source obtained and reviewed including: Chart review including previous notes, labs, imaging, consultation notes including  PDMP Allergy list   Lab Tests: na  EKG   EKG Interpretation Date/Time:    Ventricular Rate:    PR Interval:    QRS Duration:    QT Interval:    QTC Calculation:   R Axis:      Text Interpretation:           Imaging Studies ordered: I ordered imaging studies including xr cervical/thoracic I independently visualized the following imaging with scope of interpretation limited to determining acute life threatening conditions related to emergency care; findings noted above I agree with the radiologist interpretation If any imaging was obtained with contrast I closely monitored patient for any possible adverse reaction a/w contrast administration in the emergency department   Medicines ordered and prescription drug management: Meds ordered this encounter  Medications   lidocaine  (LIDODERM ) 5 % 1 patch   ketorolac  (TORADOL ) 15 MG/ML injection 15 mg   acetaminophen  (TYLENOL ) tablet 1,000 mg    -I have reviewed the patients home medicines and have made adjustments as needed   Consultations Obtained: na   Cardiac Monitoring: Continuous pulse oximetry interpreted by myself, 100% on RA.    Social Determinants of Health:  Diagnosis or treatment significantly limited by social determinants of health: current smoker   Reevaluation: After the interventions noted above, I  reevaluated the patient and found that they have improved  Co morbidities that complicate the patient evaluation  Past Medical History:  Diagnosis Date   ETOH abuse       Dispostion: Disposition decision including need for hospitalization was considered, and patient discharged from emergency department.    Final Clinical Impression(s) / ED Diagnoses Final diagnoses:  Fall, initial encounter  Cervicalgia        Elnor Savant  A, DO 06/18/24 1321

## 2024-06-18 NOTE — Discharge Instructions (Signed)
It was a pleasure caring for you today in the emergency department.  Please follow up with your PCP  Please return to the emergency department for any worsening or worrisome symptoms.

## 2024-06-18 NOTE — ED Notes (Signed)
 Patient also had chronic neck pain per the chart.  Fall was a  week ago and has been to UC.

## 2024-06-18 NOTE — ED Triage Notes (Signed)
 Pt to er, pt states that she is here because she fell a week ago.  States that she is here today because her pmd couldn't do an x ray, so she is here for an x ray.  Pt states that she has back and shoulder pain.

## 2024-09-02 ENCOUNTER — Emergency Department (HOSPITAL_COMMUNITY)

## 2024-09-02 ENCOUNTER — Emergency Department (HOSPITAL_COMMUNITY)
Admission: EM | Admit: 2024-09-02 | Discharge: 2024-09-02 | Disposition: A | Discharge status: Home / Self Care | Attending: Emergency Medicine | Admitting: Emergency Medicine

## 2024-09-02 DIAGNOSIS — S39012A Strain of muscle, fascia and tendon of lower back, initial encounter: Secondary | ICD-10-CM | POA: Insufficient documentation

## 2024-09-02 DIAGNOSIS — X500XXA Overexertion from strenuous movement or load, initial encounter: Secondary | ICD-10-CM | POA: Insufficient documentation

## 2024-09-02 DIAGNOSIS — S3992XA Unspecified injury of lower back, initial encounter: Secondary | ICD-10-CM | POA: Diagnosis present

## 2024-09-02 MED ORDER — METHOCARBAMOL 500 MG PO TABS: 500.0000 mg | ORAL_TABLET | Freq: Two times a day (BID) | ORAL | 0 refills | Status: DC

## 2024-09-02 MED ORDER — METHOCARBAMOL 500 MG PO TABS
500.0000 mg | ORAL_TABLET | Freq: Once | ORAL | Status: AC
  Administered 2024-09-02: 500 mg via ORAL
  Filled 2024-09-02: qty 1

## 2024-09-02 MED ORDER — KETOROLAC TROMETHAMINE 15 MG/ML IJ SOLN
15.0000 mg | Freq: Once | INTRAMUSCULAR | Status: AC
  Administered 2024-09-02: 15 mg via INTRAMUSCULAR
  Filled 2024-09-02: qty 1

## 2024-09-02 NOTE — Discharge Instructions (Signed)

## 2024-09-02 NOTE — ED Triage Notes (Signed)
 PT arrives from home via EMS with complaints of low back pain X2 days. PT reports moving recently with lifting. Pt states I'm old, so it takes a couple days before I feel things. Pt reports going to urgent care and receiving an injection, but states that her sx have not improved.

## 2024-09-02 NOTE — ED Provider Notes (Signed)
 " [place] EMERGENCY DEPARTMENT AT [facility] Provider Note   CSN: [id number] Arrival date & time: 09/02/24  9251     Patient presents with: Back Pain   Gabrielle Davis is a 64 y.o. female past medical history seen for alcohol abuse who presents with concern for low back pain for 2 days.  She reports that she was moving some things with lifting recently.  She went to urgent care and had some shots but she reports no significant improvement.  She does report that she had a fall a little while ago that she did not get checked out for.  She denies any numbness, tingling, loss control bladder, bowels.  No history of cancer, IV drug use, chronic corticosteroid use.    Back Pain      Prior to Admission medications  Medication Sig Start Date End Date Taking? Authorizing Provider  methocarbamol  (ROBAXIN ) 500 MG tablet Take 1 tablet (500 mg total) by mouth 2 (two) times daily. 09/02/24  Yes Trinita Devlin H, PA-C  acetaminophen  (TYLENOL ) 325 MG tablet Take 2 tablets (650 mg total) by mouth every 6 (six) hours as needed. 06/18/24   Elnor Jayson DELENA, DO  cyclobenzaprine  (FLEXERIL ) 10 MG tablet Take 1 tablet (10 mg total) by mouth 2 (two) times daily as needed for muscle spasms. 06/18/24   Elnor Jayson DELENA, DO  ibuprofen  (ADVIL ) 600 MG tablet Take 1 tablet (600 mg total) by mouth every 6 (six) hours as needed. 06/18/24   Elnor Jayson DELENA, DO  oxyCODONE  (ROXICODONE ) 5 MG immediate release tablet Take 1 tablet (5 mg total) by mouth every 6 (six) hours as needed for severe pain (pain score 7-10). 06/18/24   Elnor Jayson DELENA, DO    Allergies: Codeine    Review of Systems  Musculoskeletal:  Positive for back pain.  All other systems reviewed and are negative.   Updated Vital Signs BP 110/70 (BP Location: Right Arm)   Pulse 68   Temp 98 F (36.7 C) (Oral)   Resp 16   SpO2 99%   Physical Exam Vitals and nursing note reviewed.  Constitutional:      General: She is not in acute  distress.    Appearance: Normal appearance.  HENT:     Head: Normocephalic and atraumatic.  Eyes:     General:        Right eye: No discharge.        Left eye: No discharge.  Cardiovascular:     Rate and Rhythm: Normal rate and regular rhythm.     Pulses: Normal pulses.     Heart sounds: No murmur heard.    No friction rub. No gallop.  Pulmonary:     Effort: Pulmonary effort is normal.     Breath sounds: Normal breath sounds.  Abdominal:     General: Bowel sounds are normal.     Palpations: Abdomen is soft.  Musculoskeletal:     Comments: Mild to moderate tenderness to palpation in the lumbar paraspinous muscles.  Intact strength 5/5 bilateral lower extremities.  No straight leg raise bilaterally.  Skin:    General: Skin is warm and dry.     Capillary Refill: Capillary refill takes less than 2 seconds.  Neurological:     Mental Status: She is alert and oriented to person, place, and time.  Psychiatric:        Mood and Affect: Mood normal.        Behavior: Behavior normal.     (  all labs ordered are listed, but only abnormal results are displayed) Labs Reviewed - No data to display  EKG: None  Radiology: DG Lumbar Spine Complete Result Date: 09/02/2024 CLINICAL DATA:  Low back pain for 2 days.  Recent lifting. EXAM: LUMBAR SPINE - COMPLETE 4+ VIEW COMPARISON:  11/25/2022. FINDINGS: There is no evidence of lumbar spine fracture. Alignment is normal. Intervertebral disc spaces are maintained. IMPRESSION: Negative. Electronically Signed   By: Camellia Candle M.D.   On: 09/02/2024 08:42     Procedures   Medications Ordered in the ED  ketorolac  (TORADOL ) 15 MG/ML injection 15 mg (15 mg Intramuscular Given 09/02/24 0845)  methocarbamol  (ROBAXIN ) tablet 500 mg (500 mg Oral Given 09/02/24 0844)                                    Medical Decision Making Amount and/or Complexity of Data Reviewed Radiology: ordered.  Risk Prescription drug management.   Patient with back  pain.  My emergent differential diagnosis includes slipped disc, compression fracture, spondylolisthesis, less clinical concern for epidural abscess or osteomyelitis based on patient history.  Overall with high clinical suspicion for lumbar sprain, sciatica based on clinical presentation, risk factors.  No neurological deficits. Patient is ambulatory. No warning symptoms of back pain including: fecal incontinence, urinary retention or overflow incontinence, night sweats, waking from sleep with back pain, unexplained fevers or weight loss, h/o cancer, IVDU, recent trauma. Overall low clinical concern for cauda equina, epidural abscess, or other serious cause of back pain.   I independently interpreted imaging including plain film radiograph of the lumbar spine which shows no evidence of acute spinal abnormality. I agree with the radiologist interpretation.   Conservative measures such as rest, ice/heat, ibuprofen , Tylenol , and  prescription for Robaxin  indicated with orthopedic follow-up if no improvement with conservative management.  Extensive return precautions given, patient discharged in stable condition at this time.   Final diagnoses:  Strain of lumbar region, initial encounter    ED Discharge Orders          Ordered    methocarbamol  (ROBAXIN ) 500 MG tablet  2 times daily        09/02/24 0923               Rosan Sherlean DEL, PA-C 09/02/24 9072    Doretha Folks, MD 09/02/24 1504  "

## 2024-09-11 ENCOUNTER — Emergency Department (HOSPITAL_COMMUNITY)
Admission: EM | Admit: 2024-09-11 | Discharge: 2024-09-11 | Disposition: A | Discharge status: Home / Self Care | Attending: Emergency Medicine | Admitting: Emergency Medicine

## 2024-09-11 DIAGNOSIS — G8929 Other chronic pain: Secondary | ICD-10-CM | POA: Insufficient documentation

## 2024-09-11 DIAGNOSIS — M545 Low back pain, unspecified: Secondary | ICD-10-CM | POA: Diagnosis present

## 2024-09-11 DIAGNOSIS — F17200 Nicotine dependence, unspecified, uncomplicated: Secondary | ICD-10-CM | POA: Diagnosis not present

## 2024-09-11 MED ORDER — METHOCARBAMOL 500 MG PO TABS: 500.0000 mg | ORAL_TABLET | Freq: Three times a day (TID) | ORAL | 0 refills | Status: AC | PRN

## 2024-09-11 MED ORDER — LIDOCAINE 5 % EX PTCH
1.0000 | MEDICATED_PATCH | Freq: Once | CUTANEOUS | Status: DC
  Administered 2024-09-11: 1 via TRANSDERMAL
  Filled 2024-09-11: qty 1

## 2024-09-11 MED ORDER — METHOCARBAMOL 500 MG PO TABS
500.0000 mg | ORAL_TABLET | Freq: Once | ORAL | Status: AC
  Administered 2024-09-11: 500 mg via ORAL
  Filled 2024-09-11: qty 1

## 2024-09-11 MED ORDER — LIDOCAINE 5 % EX PTCH: 1.0000 | MEDICATED_PATCH | CUTANEOUS | 0 refills | Status: AC

## 2024-09-11 MED ORDER — IBUPROFEN 600 MG PO TABS: 600.0000 mg | ORAL_TABLET | Freq: Four times a day (QID) | ORAL | 0 refills | Status: AC | PRN

## 2024-09-11 MED ORDER — KETOROLAC TROMETHAMINE 15 MG/ML IJ SOLN
15.0000 mg | Freq: Once | INTRAMUSCULAR | Status: AC
  Administered 2024-09-11: 15 mg via INTRAMUSCULAR
  Filled 2024-09-11: qty 1

## 2024-09-11 NOTE — ED Triage Notes (Signed)
 Patient BIBA coming from home c/o chronic low back pain for 1 year. Patient is alert and oriented x 4. Airway patent, respirations even and unlabored. Skin normal, warm and dry.

## 2024-09-11 NOTE — ED Provider Notes (Signed)
 " [place] EMERGENCY DEPARTMENT AT [facility] Provider Note   CSN: [id number] Arrival date & time: 09/11/24  1035     Patient presents with: Back Pain   Gabrielle Davis is a 64 y.o. female who presents with back pain, ongoing for over 1 year. The pain is described as deep ache located in her lower back.  The patient states that the pain is worsened with movement/activity and improved with rest.  The patient denies a specific traumatic event. There is no associated numbness, tingling, weakness, joint instability, deformity, or loss of function.  Patient denies any urinary symptoms.  The patient denies any nausea, vomiting, or diarrhea.  The patient denies any abdominal pain.  There is no history of prior injury or surgery to the affected area.  The patient has tried prescribed Robaxin  for symptomatic relief that she received last week here in the emergency department, however states that it has not been working well.  Patient states that she has not tried any Tylenol  or ibuprofen  for pain relief.  For symptom relief with minimal/moderate improvement.  The patient is in no acute distress.     Back Pain      Prior to Admission medications  Medication Sig Start Date End Date Taking? Authorizing Provider  lidocaine  (LIDODERM ) 5 % Place 1 patch onto the skin daily for 7 days. Remove & Discard patch within 12 hours or as directed by MD 09/11/24 09/18/24 Yes Willma, Yan Pankratz L, PA  acetaminophen  (TYLENOL ) 325 MG tablet Take 2 tablets (650 mg total) by mouth every 6 (six) hours as needed. 06/18/24   Elnor Jayson DELENA, DO  ibuprofen  (ADVIL ) 600 MG tablet Take 1 tablet (600 mg total) by mouth every 6 (six) hours as needed for up to 7 days. 09/11/24 09/18/24  Nakhi Choi L, PA  methocarbamol  (ROBAXIN ) 500 MG tablet Take 1 tablet (500 mg total) by mouth every 8 (eight) hours as needed for up to 7 days for muscle spasms. 09/11/24 09/18/24  Willma Bouchard L, PA  oxyCODONE  (ROXICODONE ) 5 MG immediate release  tablet Take 1 tablet (5 mg total) by mouth every 6 (six) hours as needed for severe pain (pain score 7-10). 06/18/24   Elnor Jayson DELENA, DO    Allergies: Codeine    Review of Systems  Musculoskeletal:  Positive for back pain.    Updated Vital Signs BP 128/81 (BP Location: Left Arm)   Pulse 67   Temp 98.4 F (36.9 C) (Oral)   Resp 16   SpO2 100%   Physical Exam Vitals and nursing note reviewed.  Constitutional:      General: She is not in acute distress.    Appearance: Normal appearance.  HENT:     Head: Normocephalic and atraumatic.  Eyes:     Extraocular Movements: Extraocular movements intact.     Conjunctiva/sclera: Conjunctivae normal.     Pupils: Pupils are equal, round, and reactive to light.  Cardiovascular:     Rate and Rhythm: Normal rate and regular rhythm.     Pulses: Normal pulses.  Pulmonary:     Effort: Pulmonary effort is normal. No respiratory distress.     Comments: Patient has no difficulty speaking in complete sentences. Abdominal:     General: Abdomen is flat.     Palpations: Abdomen is soft.     Tenderness: There is no abdominal tenderness.  Musculoskeletal:        General: Normal range of motion.     Cervical back: Normal  range of motion.     Lumbar back: Tenderness present.     Comments: No bony tenderness on palpation.  Tenderness to paraspinal lumbar/sacral paraspinal muscles.  No deformity, trauma, or decreased ROM.  Neurovascular intact.  No difficulty with ambulation visualized during visit.  Skin:    General: Skin is warm and dry.     Capillary Refill: Capillary refill takes less than 2 seconds.  Neurological:     General: No focal deficit present.     Mental Status: She is alert. Mental status is at baseline.  Psychiatric:        Mood and Affect: Mood normal.     (all labs ordered are listed, but only abnormal results are displayed) Labs Reviewed - No data to display  EKG: None  Radiology: No results found.   Procedures    Medications Ordered in the ED  lidocaine  (LIDODERM ) 5 % 1-3 patch (1 patch Transdermal Patch Applied 09/11/24 1253)  ketorolac  (TORADOL ) 15 MG/ML injection 15 mg (15 mg Intramuscular Given 09/11/24 1253)  methocarbamol  (ROBAXIN ) tablet 500 mg (500 mg Oral Given 09/11/24 1253)                                  Medical Decision Making Risk Prescription drug management.   Patient presents to the ED for: back pain This involves an extensive number of treatment options  Differential diagnosis includes:  Minor, MSK etiology Traumatic etiology Co-morbid conditions: Chronic back pain, everyday smoker, EtOH use  Additional history/records obtained and reviewed: Additional history obtained from  outside medical records External records from outside source obtained and reviewed including ED visit on 1/19 for same complaint.  Clinical Course as of 09/11/24 1340  Wed Sep 11, 2024  1201 Temp: 98.4 F (36.9 C) Afebrile, vitals stable, patient in no acute distress  [ML]  1336 Patient given ketorolac  and methocarbamol  for symptomatic relief-well-tolerated with improvement. [ML]    Clinical Course User Index [ML] Willma Duwaine CROME, [place]    Management / Treatments: See ED course above for medications, treatments administered, and clinical rationale.   Reevaluation of the patient after these medicines showed that the patient improved.  I have reviewed the patients home medicines and have made adjustments as needed  Test Considered/Diagnostic tools:  Additional diagnostic testing such as laboratory studies or imaging were considered however based on the patients presenting symptoms and initial clinical assessment deemed not necessary at this time.  ED Course / Reassessments: Problem List:  64 year old female presented for chronic back pain. Initial assessment included history, physical exam, and review of prior medical records. Based on the reassuring exam findings there was low clinical suspicion  for fracture, dislocation, septic joint, compartment syndrome, or neurovascular injury.  The patient was treated conservatively with analgesia and supportive care, with no worsening of symptoms during ED course.  Patient on reassessment was walking around in the ED bed talking on the cell phone and appeared in no acute distress.  When asked, she stated that her pain notably decreased and was feeling much better after medication administration.  The patient was deemed appropriate for discharge with a diagnosis of likely musculoskeletal strain/sprain.  Discharge instructions including rest, activity modification, ice/heat as appropriate, and use of NSAIDs or acetaminophen  for pain control.  Patient's ibuprofen  will prescription was refilled, along with her methocarbamol , and added lidocaine  patches for symptomatic relief.  The patient was advised to follow-up with primary care if  symptoms persist or worsen.  Strict return precautions were provided for increasing pain, swelling, numbness, weakness or loss of function.  Patient response: Improved with ED management Serial reassessments performed: Yes    Social determinants impacting care: limited/at-risk for follow-up  Disposition: Disposition: Discharge with close follow-up with PCP for further evaluation and care Rationale for disposition: stable for discharge The disposition plan and rationale were discussed with the patient at the bedside, all questions were addressed, and the patient demonstrated understanding.  This note was produced using Electronics Engineer. While I have reviewed and verified all clinical information, transcription errors may remain.      Final diagnoses:  Chronic low back pain, unspecified back pain laterality, unspecified whether sciatica present    ED Discharge Orders          Ordered    lidocaine  (LIDODERM ) 5 %  Every 24 hours        09/11/24 1331    methocarbamol  (ROBAXIN ) 500 MG tablet  Every 8 hours  PRN        09/11/24 1331    ibuprofen  (ADVIL ) 600 MG tablet  Every 6 hours PRN        09/11/24 1331               Gabrielle Davis, [place] 09/11/24 1355    Gabrielle Sid SAILOR, MD 09/17/24 [phone]  "

## 2024-09-11 NOTE — Discharge Instructions (Signed)
 Thank you for visiting the Emergency Department today. It was a pleasure to be part of your healthcare team.   Your were seen today for back pain  As discussed, rest, hydrate, resume diet as tolerated, utilize Tylenol  and ibuprofen  as needed for pain relief, utilize muscle relaxants, and lidocaine  patches as needed. You should take your medications as directed. If you have any questions about your medicines, please call your pharmacy or healthcare provider.  It is important to watch for warning signs such as worsening pain or any new/worsening symptoms. If any of these happen, return to the Emergency Department or call 911.  Thank you for trusting us  with your health.
